# Patient Record
Sex: Female | Born: 1985 | Race: Black or African American | Hispanic: No | Marital: Married | Smoking: Never smoker
Health system: Southern US, Community
[De-identification: ages and names within clinical notes are randomized; demographics above are authoritative.]

## PROBLEM LIST (undated history)

## (undated) ENCOUNTER — Inpatient Hospital Stay (HOSPITAL_COMMUNITY): Payer: Self-pay

## (undated) DIAGNOSIS — B977 Papillomavirus as the cause of diseases classified elsewhere: Secondary | ICD-10-CM

## (undated) DIAGNOSIS — Z9889 Other specified postprocedural states: Secondary | ICD-10-CM

## (undated) DIAGNOSIS — IMO0002 Reserved for concepts with insufficient information to code with codable children: Secondary | ICD-10-CM

## (undated) DIAGNOSIS — N870 Mild cervical dysplasia: Secondary | ICD-10-CM

## (undated) HISTORY — DX: Reserved for concepts with insufficient information to code with codable children: IMO0002

## (undated) HISTORY — DX: Papillomavirus as the cause of diseases classified elsewhere: B97.7

## (undated) HISTORY — PX: NO PAST SURGERIES: SHX2092

## (undated) HISTORY — DX: Other specified postprocedural states: Z98.890

## (undated) HISTORY — DX: Mild cervical dysplasia: N87.0

---

## 2009-05-03 DIAGNOSIS — B977 Papillomavirus as the cause of diseases classified elsewhere: Secondary | ICD-10-CM

## 2009-05-03 HISTORY — DX: Papillomavirus as the cause of diseases classified elsewhere: B97.7

## 2009-09-08 ENCOUNTER — Encounter: Admission: RE | Admit: 2009-09-08 | Discharge: 2009-09-08 | Payer: Self-pay | Admitting: Infectious Diseases

## 2010-05-03 DIAGNOSIS — N870 Mild cervical dysplasia: Secondary | ICD-10-CM

## 2010-05-03 DIAGNOSIS — IMO0002 Reserved for concepts with insufficient information to code with codable children: Secondary | ICD-10-CM

## 2010-05-03 HISTORY — DX: Reserved for concepts with insufficient information to code with codable children: IMO0002

## 2010-05-03 HISTORY — DX: Mild cervical dysplasia: N87.0

## 2011-11-09 ENCOUNTER — Ambulatory Visit (INDEPENDENT_AMBULATORY_CARE_PROVIDER_SITE_OTHER): Payer: BC Managed Care – PPO | Admitting: Obstetrics and Gynecology

## 2011-11-09 ENCOUNTER — Encounter: Payer: Self-pay | Admitting: Obstetrics and Gynecology

## 2011-11-09 VITALS — BP 104/60 | HR 68 | Resp 16 | Ht 64.0 in | Wt 144.0 lb

## 2011-11-09 DIAGNOSIS — R87612 Low grade squamous intraepithelial lesion on cytologic smear of cervix (LGSIL): Secondary | ICD-10-CM

## 2011-11-09 DIAGNOSIS — IMO0002 Reserved for concepts with insufficient information to code with codable children: Secondary | ICD-10-CM

## 2011-11-09 DIAGNOSIS — Z01419 Encounter for gynecological examination (general) (routine) without abnormal findings: Secondary | ICD-10-CM

## 2011-11-09 MED ORDER — NORGESTIM-ETH ESTRAD TRIPHASIC 0.18/0.215/0.25 MG-35 MCG PO TABS: 1.0000 | ORAL_TABLET | Freq: Every day | ORAL | Status: DC

## 2011-11-09 NOTE — Progress Notes (Signed)
Contraception none Last pap 01/2011 LSIL HPV/Mild Dysplasia/CINI Last Mammo none Last Colonoscopy none Last Dexa Scan none Primary MD Dawn Metkalf Abuse at Home none  No complaints.  Wants BC.  Filed Vitals:   11/09/11 1650  BP: 104/60  Pulse: 68  Resp: 16   ROS: noncontributory  Physical Examination: General appearance - alert, well appearing, and in no distress Neck - supple, no significant adenopathy Chest - clear to auscultation, no wheezes, rales or rhonchi, symmetric air entry Heart - normal rate and regular rhythm Abdomen - soft, nontender, nondistended, no masses or organomegaly Breasts - breasts appear normal, no suspicious masses, no skin or nipple changes or axillary nodes Pelvic - normal external genitalia, vulva, vagina, cervix, uterus and adnexa Back exam - no CVAT Extremities - no edema, redness or tenderness in the calves or thighs  A/P OTC Pap

## 2011-11-11 LAB — PAP IG W/ RFLX HPV ASCU

## 2011-11-14 DIAGNOSIS — IMO0002 Reserved for concepts with insufficient information to code with codable children: Secondary | ICD-10-CM | POA: Insufficient documentation

## 2011-11-14 NOTE — Progress Notes (Signed)
Quick Note:  Please schedule colposcopy. Thanks ______ 

## 2011-11-16 ENCOUNTER — Telehealth: Payer: Self-pay

## 2011-11-16 NOTE — Telephone Encounter (Signed)
Attempted to call pt to advise Colpo needed and pt's phone was out of network unable to leave msg. Will try to call again later. Mathis Bud

## 2011-11-17 ENCOUNTER — Telehealth: Payer: Self-pay

## 2011-11-17 NOTE — Telephone Encounter (Signed)
Attempted to reach pt again regarding scheduling Colpo, pt phone still not working. Will try to contact over the next few days and if no response letter will be sent. Mathis Bud

## 2011-11-22 ENCOUNTER — Telehealth: Payer: Self-pay

## 2011-11-22 NOTE — Telephone Encounter (Signed)
Pt's phone is still out of order. Letter will be mailed regarding pap results/ need colpo. Brittany Wiggins

## 2012-01-17 ENCOUNTER — Telehealth: Payer: Self-pay | Admitting: Obstetrics and Gynecology

## 2012-01-18 ENCOUNTER — Other Ambulatory Visit: Payer: Self-pay

## 2012-01-18 ENCOUNTER — Encounter: Payer: Self-pay | Admitting: Obstetrics and Gynecology

## 2012-01-18 ENCOUNTER — Ambulatory Visit (INDEPENDENT_AMBULATORY_CARE_PROVIDER_SITE_OTHER): Payer: BC Managed Care – PPO | Admitting: Obstetrics and Gynecology

## 2012-01-18 VITALS — BP 116/72 | HR 78 | Wt 150.0 lb

## 2012-01-18 DIAGNOSIS — B373 Candidiasis of vulva and vagina: Secondary | ICD-10-CM

## 2012-01-18 DIAGNOSIS — N898 Other specified noninflammatory disorders of vagina: Secondary | ICD-10-CM

## 2012-01-18 DIAGNOSIS — Z113 Encounter for screening for infections with a predominantly sexual mode of transmission: Secondary | ICD-10-CM

## 2012-01-18 LAB — POCT WET PREP (WET MOUNT)
Whiff Test: NEGATIVE
pH: 4.5

## 2012-01-18 MED ORDER — FLUCONAZOLE 150 MG PO TABS: 150.0000 mg | ORAL_TABLET | Freq: Once | ORAL | Status: AC

## 2012-01-18 NOTE — Progress Notes (Signed)
26 YO complains of vaginal itching for the past 3 days but denies change in soap/detergents, antibiotics, urinary symptoms or changes in bowel movements. Admits to post coital dyspareunia but no other pelvic pain.  O: Pelvic: EGBUS-wnl except with adherent white discharge, vagina-large white discharge, cervix-no lesions, uterus/adnexae-no tenderness   Wet Prep:  pH-4.5, whiff-negative,  large yeast (pseudo-hyphae)  A: Yeast Vulvo-Vaginitis  P: perineal hygiene      STD-testing      Diflucan 150 mg #1 1 po stat 1 rf       RTO-as scheduled       Alveena Taira

## 2012-01-18 NOTE — Progress Notes (Signed)
Color: white Odor: no Itching:yes Thin:yes Thick:no Fever:no Dyspareunia:yes after Hx PID:no HX STD:no Pelvic Pain:no Desires Gc/CT:yes Desires HIV,RPR,HbsAG:yes

## 2012-01-18 NOTE — Patient Instructions (Signed)
Avoid: - excess soap on genital area (consider using plain oatmeal soap) - use of powder or sprays in genital area - douching - wearing underwear to bed (except with menses) - using more than is directed detergent when washing clothes - tight fitting garments around genital area - excess sugar intake   

## 2012-01-19 LAB — HIV ANTIBODY (ROUTINE TESTING W REFLEX): HIV: NONREACTIVE

## 2012-01-19 LAB — GC/CHLAMYDIA PROBE AMP, GENITAL: GC Probe Amp, Genital: NEGATIVE

## 2012-01-19 LAB — HSV 2 ANTIBODY, IGG: HSV 2 Glycoprotein G Ab, IgG: <0.1 IV

## 2012-01-19 LAB — RPR

## 2012-05-26 ENCOUNTER — Ambulatory Visit: Payer: BC Managed Care – PPO | Admitting: Obstetrics and Gynecology

## 2012-05-26 ENCOUNTER — Encounter: Payer: Self-pay | Admitting: Obstetrics and Gynecology

## 2012-05-26 VITALS — BP 120/64 | Ht 64.0 in | Wt 152.0 lb

## 2012-05-26 DIAGNOSIS — Z139 Encounter for screening, unspecified: Secondary | ICD-10-CM

## 2012-05-26 DIAGNOSIS — Z113 Encounter for screening for infections with a predominantly sexual mode of transmission: Secondary | ICD-10-CM

## 2012-05-26 DIAGNOSIS — N760 Acute vaginitis: Secondary | ICD-10-CM

## 2012-05-26 DIAGNOSIS — N898 Other specified noninflammatory disorders of vagina: Secondary | ICD-10-CM

## 2012-05-26 LAB — POCT WET PREP (WET MOUNT)
Clue Cells Wet Prep Whiff POC: NEGATIVE
pH: 4.5

## 2012-05-26 NOTE — Progress Notes (Signed)
C/o itching in vagina  Filed Vitals:   05/26/12 1430  BP: 120/64   ROS: noncontributory  Pelvic exam:  VULVA: normal appearing vulva with no masses, tenderness or lesions,  VAGINA: normal appearing vagina with normal color and discharge, no lesions, CERVIX: normal appearing cervix without discharge or lesions,  UTERUS: uterus is normal size, shape, consistency and nontender,  ADNEXA: normal adnexa in size, nontender and no masses.  A/P Wet prep - neg Vaginitis - trial of diflucan Check HSV 1 and 2 and vit D (pt reports recurrent vaginitis since August) LGSIL in July with no f/u - sched colpo will likely repeat pap that day as well

## 2012-05-27 LAB — VITAMIN D 25 HYDROXY (VIT D DEFICIENCY, FRACTURES): Vit D, 25-Hydroxy: 26 ng/mL — ABNORMAL LOW (ref 30–89)

## 2012-06-01 ENCOUNTER — Other Ambulatory Visit: Payer: Self-pay

## 2012-06-01 ENCOUNTER — Telehealth: Payer: Self-pay

## 2012-06-01 DIAGNOSIS — E559 Vitamin D deficiency, unspecified: Secondary | ICD-10-CM

## 2012-06-01 MED ORDER — VITAMIN D3 1.25 MG (50000 UT) PO CAPS: 1.0000 | ORAL_CAPSULE | ORAL | Status: DC

## 2012-06-01 NOTE — Telephone Encounter (Signed)
E-rx'd Vitamin D protocol 50,000 units 1x q week x 12 weeks to [facility] [address] . Future lab and recall entered. Melody Comas A

## 2012-06-01 NOTE — Telephone Encounter (Signed)
Notified pt of Vit . D deficiency . Pt will pick up Vitamin D Rx at pharmacy. Melody Comas A

## 2012-12-12 ENCOUNTER — Inpatient Hospital Stay (HOSPITAL_COMMUNITY)
Admission: AD | Admit: 2012-12-12 | Discharge: 2012-12-12 | Disposition: A | Discharge status: Home / Self Care | Payer: BC Managed Care – PPO | Source: Ambulatory Visit | Attending: Obstetrics and Gynecology | Admitting: Obstetrics and Gynecology

## 2012-12-12 ENCOUNTER — Encounter (HOSPITAL_COMMUNITY): Payer: Self-pay | Admitting: *Deleted

## 2012-12-12 DIAGNOSIS — O200 Threatened abortion: Secondary | ICD-10-CM | POA: Insufficient documentation

## 2012-12-12 DIAGNOSIS — R109 Unspecified abdominal pain: Secondary | ICD-10-CM | POA: Insufficient documentation

## 2012-12-12 LAB — WET PREP, GENITAL
Clue Cells Wet Prep HPF POC: NONE SEEN
Trich, Wet Prep: NONE SEEN
WBC, Wet Prep HPF POC: NONE SEEN
Yeast Wet Prep HPF POC: NONE SEEN

## 2012-12-12 LAB — URINALYSIS, ROUTINE W REFLEX MICROSCOPIC
Nitrite: NEGATIVE
Protein, ur: NEGATIVE mg/dL
Urobilinogen, UA: 0.2 mg/dL (ref 0.0–1.0)
pH: 6 (ref 5.0–8.0)

## 2012-12-12 LAB — URINE MICROSCOPIC-ADD ON

## 2012-12-12 LAB — CBC
HCT: 35.8 % — ABNORMAL LOW (ref 36.0–46.0)
MCH: 28.1 pg (ref 26.0–34.0)

## 2012-12-12 LAB — HCG, QUANTITATIVE, PREGNANCY: hCG, Beta Chain, Quant, S: 1261 m[IU]/mL — ABNORMAL HIGH

## 2012-12-12 LAB — ABO/RH: ABO/RH(D): B POS

## 2012-12-12 NOTE — Progress Notes (Addendum)
MAU NOTE: (assumed care of pt at 1900 on 12/12/12) S:  Pt is w/o pain presently; reports occ'l cramping.  Found out pregnant over last weekend.  Unplanned pregnancy; not on any contraception.  Pt is a 1st grade teacher.  S.o. At bs; he is a Runner, broadcasting/film/video also (HS).  No IC in last few days.  Has [facility] card confirmed B pos blood type.   O: PE: Gen:  NAD, A&OX3, pleasant, glasses Abd: soft, NT, no rebound or guarding SSE: mod amt of BRB in vault w/ pieces of tissues, stringy blood clots noted; cleared vault w/ 3-4 fox swabs; wet prep and gc/ct sent  Results for orders placed during the hospital encounter of 12/12/12 (from the past 24 hour(s))  CBC     Status: Abnormal   Collection Time    12/12/12  4:00 PM      Result Value Range   WBC 7.2  4.0 - 10.5 K/uL   RBC 4.30  3.87 - 5.11 MIL/uL   Hemoglobin 12.1  12.0 - 15.0 g/dL   HCT 40.9 (*) 81.1 - 91.4 %   MCV 83.3  78.0 - 100.0 fL   MCH 28.1  26.0 - 34.0 pg   MCHC 33.8  30.0 - 36.0 g/dL   RDW 78.2  95.6 - 21.3 %   Platelets 314  150 - 400 K/uL  HCG, QUANTITATIVE, PREGNANCY     Status: Abnormal   Collection Time    12/12/12  4:00 PM      Result Value Range   hCG, Beta Chain, Quant, S 1261 (*) <5 mIU/mL  ABO/RH     Status: None   Collection Time    12/12/12  4:00 PM      Result Value Range   ABO/RH(D) B POS    URINALYSIS, ROUTINE W REFLEX MICROSCOPIC     Status: Abnormal   Collection Time    12/12/12  7:01 PM      Result Value Range   Color, Urine YELLOW  YELLOW   APPearance CLEAR  CLEAR   Specific Gravity, Urine >1.030 (*) 1.005 - 1.030   pH 6.0  5.0 - 8.0   Glucose, UA NEGATIVE  NEGATIVE mg/dL   Hgb urine dipstick LARGE (*) NEGATIVE   Bilirubin Urine NEGATIVE  NEGATIVE   Ketones, ur NEGATIVE  NEGATIVE mg/dL   Protein, ur NEGATIVE  NEGATIVE mg/dL   Urobilinogen, UA 0.2  0.0 - 1.0 mg/dL   Nitrite NEGATIVE  NEGATIVE   Leukocytes, UA NEGATIVE  NEGATIVE  URINE MICROSCOPIC-ADD ON     Status: Abnormal   Collection Time    12/12/12   7:01 PM      Result Value Range   Squamous Epithelial / LPF FEW (*) RARE   WBC, UA 0-2  <3 WBC/hpf   RBC / HPF TOO NUMEROUS TO COUNT  <3 RBC/hpf   Bacteria, UA MANY (*) RARE  POCT PREGNANCY, URINE     Status: Abnormal   Collection Time    12/12/12  7:09 PM      Result Value Range   Preg Test, Ur POSITIVE (*) NEGATIVE   A:  1. [redacted]w[redacted]d 2. Threatened AB  3. Rh pos 4. VB in 1st trimester 5. Inadequate water intake 6. Gc/ct cx and wet prep negative  P:  1. D/c'd home to f/u for lab only visit this Thurs (late afternoon) at [facility] to repeat quant      2. Bleeding and SAB precautions rev'd; rec'd daily PNV  3. F/u prn; if quant rises appropriately, will schedule NOB and viability u/s C. Denny Levy, CNM

## 2012-12-12 NOTE — MAU Provider Note (Signed)
History   27yo G1P0 at  Presents with bleeding and cramping after 2 pos HPT.  Denies recent fever, resp or GI c/o's, UTI s/s.  Chief Complaint  Patient presents with  . Vaginal Bleeding  . Possible Pregnancy    OB History   Grav Para Term Preterm Abortions TAB SAB Ect Mult Living   1 0              Past Medical History  Diagnosis Date  . LSIL (low grade squamous intraepithelial lesion) on Pap smear 2012  . Dysplasia of cervix, low grade (CIN 1) 2012  . HPV (human papilloma virus) infection 2011  . Hx of colposcopy with cervical biopsy 2010, 2012    Past Surgical History  Procedure Laterality Date  . No past surgeries      Family History  Problem Relation Age of Onset  . Hypertension Mother   . Hypertension Father   . Diabetes Father   . Migraines Father   . Diabetes Paternal Grandmother   . Diabetes Paternal Grandfather     History  Substance Use Topics  . Smoking status: Never Smoker   . Smokeless tobacco: Never Used  . Alcohol Use: Yes    Allergies:  Allergies  Allergen Reactions  . Fish Allergy     No prescriptions prior to admission    ROS: see HPI above, all other systems are negative   Physical Exam   Blood pressure 130/82, pulse 54, temperature 98.6 F (37 C), temperature source Oral, resp. rate 18, height 5\' 3"  (1.6 m), weight 161 lb (73.029 kg), last menstrual period 11/11/2012.    ED Course  IUP at [redacted]w[redacted]d Bleeding in early pregnancy  CBC  Care transferred to nightshift CNM  Haroldine Laws CNM, MSN 12/13/2012 8:19 AM

## 2012-12-12 NOTE — MAU Note (Signed)
Period was due on Fri, wasn't feeling right, did a test on Sat and Sun, both were positive.  Lower abd pain- cramping like with period. Spotting this morning, then had increase in cramping, passed a 'glob of blood' put on pad, saturated pad in a couple hrs, passed another glob.

## 2012-12-13 LAB — GC/CHLAMYDIA PROBE AMP: CT Probe RNA: NEGATIVE

## 2013-05-29 ENCOUNTER — Ambulatory Visit (HOSPITAL_COMMUNITY): Payer: BC Managed Care – PPO

## 2013-06-05 ENCOUNTER — Ambulatory Visit (HOSPITAL_COMMUNITY): Payer: BC Managed Care – PPO

## 2013-10-17 ENCOUNTER — Encounter (HOSPITAL_COMMUNITY): Payer: Self-pay | Admitting: *Deleted

## 2013-11-14 LAB — OB RESULTS CONSOLE RPR: RPR: NONREACTIVE

## 2013-11-14 LAB — OB RESULTS CONSOLE GC/CHLAMYDIA
CHLAMYDIA, DNA PROBE: NEGATIVE
Gonorrhea: NEGATIVE

## 2013-11-14 LAB — OB RESULTS CONSOLE HEPATITIS B SURFACE ANTIGEN: Hepatitis B Surface Ag: NEGATIVE

## 2013-11-14 LAB — OB RESULTS CONSOLE RUBELLA ANTIBODY, IGM: RUBELLA: IMMUNE

## 2013-11-14 LAB — OB RESULTS CONSOLE HIV ANTIBODY (ROUTINE TESTING): HIV: NONREACTIVE

## 2014-03-04 ENCOUNTER — Encounter (HOSPITAL_COMMUNITY): Payer: Self-pay | Admitting: *Deleted

## 2014-05-03 NOTE — L&D Delivery Note (Signed)
Operative Delivery Note  Due to late decelerations with pushing, operative vaginal delivery was discussed with patient.  Verbal consent: obtained from patient.  Risks and benefits discussed in detail.  Risks include, but are not limited to the risks of anesthesia, bleeding, infection, damage to maternal tissues, fetal cephalhematoma.  There is also the risk of inability to effect vaginal delivery of the head, or shoulder dystocia that cannot be resolved by established maneuvers, leading to the need for emergency cesarean section.  Kiwi vacuum applied, 3 pulls, no pop offs.  At 10:40 AM a viable female was delivered via Vaginal, Vacuum Investment banker, operational(Extractor).  Presentation: vertex; Position: Left,, Occiput,, Anterior; Station: +3.  APGAR: 9, 9; weight  pending Placenta status: Intact, Spontaneous.   Cord: 3 vessels with the following complications: None.  Cord pH: pending  Anesthesia: Epidural  Instruments: Kiwi vacuum Episiotomy: None Lacerations: 2nd degree Suture Repair: 2.0 3.0 vicryl Est. Blood Loss (mL):  350cc  Mom to postpartum.  Baby to Couplet care / Skin to Skin.  Brittany Wiggins, Brittany Wiggins, M 06/15/2014, 11:20 AM

## 2014-06-09 LAB — OB RESULTS CONSOLE GBS: STREP GROUP B AG: NEGATIVE

## 2014-06-15 ENCOUNTER — Inpatient Hospital Stay (HOSPITAL_COMMUNITY)
Admission: AD | Admit: 2014-06-15 | Discharge: 2014-06-17 | DRG: 775 | Disposition: A | Discharge status: Home / Self Care | Payer: BC Managed Care – PPO | Source: Ambulatory Visit | Attending: Obstetrics and Gynecology | Admitting: Obstetrics and Gynecology

## 2014-06-15 ENCOUNTER — Inpatient Hospital Stay (HOSPITAL_COMMUNITY): Payer: BC Managed Care – PPO

## 2014-06-15 ENCOUNTER — Encounter (HOSPITAL_COMMUNITY): Payer: Self-pay | Admitting: Anesthesiology

## 2014-06-15 ENCOUNTER — Inpatient Hospital Stay (HOSPITAL_COMMUNITY): Payer: BC Managed Care – PPO | Admitting: Anesthesiology

## 2014-06-15 DIAGNOSIS — Z8759 Personal history of other complications of pregnancy, childbirth and the puerperium: Secondary | ICD-10-CM

## 2014-06-15 DIAGNOSIS — Z3A37 37 weeks gestation of pregnancy: Secondary | ICD-10-CM | POA: Diagnosis present

## 2014-06-15 DIAGNOSIS — Z3483 Encounter for supervision of other normal pregnancy, third trimester: Secondary | ICD-10-CM | POA: Diagnosis present

## 2014-06-15 DIAGNOSIS — O9902 Anemia complicating childbirth: Secondary | ICD-10-CM | POA: Diagnosis present

## 2014-06-15 DIAGNOSIS — D649 Anemia, unspecified: Secondary | ICD-10-CM | POA: Diagnosis present

## 2014-06-15 DIAGNOSIS — T81509A Unspecified complication of foreign body accidentally left in body following unspecified procedure, initial encounter: Secondary | ICD-10-CM

## 2014-06-15 DIAGNOSIS — IMO0001 Reserved for inherently not codable concepts without codable children: Secondary | ICD-10-CM

## 2014-06-15 LAB — CBC
HEMATOCRIT: 34.8 % — AB (ref 36.0–46.0)
Hemoglobin: 11.8 g/dL — ABNORMAL LOW (ref 12.0–15.0)
MCH: 28.9 pg (ref 26.0–34.0)
MCHC: 33.9 g/dL (ref 30.0–36.0)
MCV: 85.3 fL (ref 78.0–100.0)
PLATELETS: 313 10*3/uL (ref 150–400)
RBC: 4.08 MIL/uL (ref 3.87–5.11)
RDW: 14.2 % (ref 11.5–15.5)
WBC: 9.5 10*3/uL (ref 4.0–10.5)

## 2014-06-15 LAB — TYPE AND SCREEN
ABO/RH(D): B POS
ANTIBODY SCREEN: NEGATIVE

## 2014-06-15 MED ORDER — NALBUPHINE HCL 10 MG/ML IJ SOLN: 10.0000 mg | INTRAMUSCULAR | Status: DC | PRN

## 2014-06-15 MED ORDER — TERBUTALINE SULFATE 1 MG/ML IJ SOLN
0.2500 mg | Freq: Once | INTRAMUSCULAR | Status: DC | PRN
  Filled 2014-06-15: qty 1

## 2014-06-15 MED ORDER — PHENYLEPHRINE 40 MCG/ML (10ML) SYRINGE FOR IV PUSH (FOR BLOOD PRESSURE SUPPORT)
80.0000 ug | PREFILLED_SYRINGE | INTRAVENOUS | Status: DC | PRN
  Filled 2014-06-15: qty 2
  Filled 2014-06-15: qty 20

## 2014-06-15 MED ORDER — EPHEDRINE 5 MG/ML INJ
10.0000 mg | INTRAVENOUS | Status: DC | PRN
  Filled 2014-06-15: qty 2

## 2014-06-15 MED ORDER — OXYCODONE-ACETAMINOPHEN 5-325 MG PO TABS: 1.0000 | ORAL_TABLET | ORAL | Status: DC | PRN

## 2014-06-15 MED ORDER — ACETAMINOPHEN 325 MG PO TABS: 650.0000 mg | ORAL_TABLET | ORAL | Status: DC | PRN

## 2014-06-15 MED ORDER — PHENYLEPHRINE 40 MCG/ML (10ML) SYRINGE FOR IV PUSH (FOR BLOOD PRESSURE SUPPORT)
80.0000 ug | PREFILLED_SYRINGE | INTRAVENOUS | Status: DC | PRN
  Filled 2014-06-15: qty 2

## 2014-06-15 MED ORDER — OXYTOCIN 40 UNITS IN LACTATED RINGERS INFUSION - SIMPLE MED
1.0000 m[IU]/min | INTRAVENOUS | Status: DC
  Administered 2014-06-15: 2 m[IU]/min via INTRAVENOUS

## 2014-06-15 MED ORDER — LACTATED RINGERS IV SOLN
500.0000 mL | Freq: Once | INTRAVENOUS | Status: AC
  Administered 2014-06-15: 500 mL via INTRAVENOUS

## 2014-06-15 MED ORDER — LANOLIN HYDROUS EX OINT: TOPICAL_OINTMENT | CUTANEOUS | Status: DC | PRN

## 2014-06-15 MED ORDER — ZOLPIDEM TARTRATE 5 MG PO TABS: 5.0000 mg | ORAL_TABLET | Freq: Every evening | ORAL | Status: DC | PRN

## 2014-06-15 MED ORDER — LACTATED RINGERS IV SOLN
INTRAVENOUS | Status: DC
  Administered 2014-06-15 (×2): via INTRAVENOUS

## 2014-06-15 MED ORDER — DIBUCAINE 1 % RE OINT
1.0000 "application " | TOPICAL_OINTMENT | RECTAL | Status: DC | PRN
  Administered 2014-06-16: 1 via RECTAL
  Filled 2014-06-15: qty 28

## 2014-06-15 MED ORDER — IBUPROFEN 600 MG PO TABS
600.0000 mg | ORAL_TABLET | Freq: Four times a day (QID) | ORAL | Status: DC
  Administered 2014-06-15 – 2014-06-17 (×8): 600 mg via ORAL
  Filled 2014-06-15 (×8): qty 1

## 2014-06-15 MED ORDER — OXYCODONE-ACETAMINOPHEN 5-325 MG PO TABS: 2.0000 | ORAL_TABLET | ORAL | Status: DC | PRN

## 2014-06-15 MED ORDER — OXYCODONE-ACETAMINOPHEN 5-325 MG PO TABS
1.0000 | ORAL_TABLET | ORAL | Status: DC | PRN
  Filled 2014-06-15: qty 1

## 2014-06-15 MED ORDER — OXYTOCIN BOLUS FROM INFUSION: 500.0000 mL | INTRAVENOUS | Status: DC

## 2014-06-15 MED ORDER — ONDANSETRON HCL 4 MG/2ML IJ SOLN: 4.0000 mg | Freq: Four times a day (QID) | INTRAMUSCULAR | Status: DC | PRN

## 2014-06-15 MED ORDER — ONDANSETRON HCL 4 MG/2ML IJ SOLN: 4.0000 mg | INTRAMUSCULAR | Status: DC | PRN

## 2014-06-15 MED ORDER — LIDOCAINE HCL (PF) 1 % IJ SOLN
30.0000 mL | INTRAMUSCULAR | Status: DC | PRN
  Filled 2014-06-15: qty 30

## 2014-06-15 MED ORDER — LACTATED RINGERS IV SOLN: 500.0000 mL | INTRAVENOUS | Status: DC | PRN

## 2014-06-15 MED ORDER — DIPHENHYDRAMINE HCL 25 MG PO CAPS: 25.0000 mg | ORAL_CAPSULE | Freq: Four times a day (QID) | ORAL | Status: DC | PRN

## 2014-06-15 MED ORDER — DIPHENHYDRAMINE HCL 50 MG/ML IJ SOLN: 12.5000 mg | INTRAMUSCULAR | Status: DC | PRN

## 2014-06-15 MED ORDER — SIMETHICONE 80 MG PO CHEW: 80.0000 mg | CHEWABLE_TABLET | ORAL | Status: DC | PRN

## 2014-06-15 MED ORDER — FENTANYL 2.5 MCG/ML BUPIVACAINE 1/10 % EPIDURAL INFUSION (WH - ANES)
14.0000 mL/h | INTRAMUSCULAR | Status: DC | PRN
  Administered 2014-06-15: 14 mL/h via EPIDURAL
  Filled 2014-06-15: qty 125

## 2014-06-15 MED ORDER — SENNOSIDES-DOCUSATE SODIUM 8.6-50 MG PO TABS
2.0000 | ORAL_TABLET | ORAL | Status: DC
  Administered 2014-06-15 – 2014-06-16 (×2): 2 via ORAL
  Filled 2014-06-15 (×2): qty 2

## 2014-06-15 MED ORDER — CITRIC ACID-SODIUM CITRATE 334-500 MG/5ML PO SOLN: 30.0000 mL | ORAL | Status: DC | PRN

## 2014-06-15 MED ORDER — TETANUS-DIPHTH-ACELL PERTUSSIS 5-2.5-18.5 LF-MCG/0.5 IM SUSP: 0.5000 mL | Freq: Once | INTRAMUSCULAR | Status: DC

## 2014-06-15 MED ORDER — ONDANSETRON HCL 4 MG PO TABS: 4.0000 mg | ORAL_TABLET | ORAL | Status: DC | PRN

## 2014-06-15 MED ORDER — BENZOCAINE-MENTHOL 20-0.5 % EX AERO
1.0000 "application " | INHALATION_SPRAY | CUTANEOUS | Status: DC | PRN
  Administered 2014-06-15: 1 via TOPICAL
  Filled 2014-06-15 (×2): qty 56

## 2014-06-15 MED ORDER — OXYTOCIN 40 UNITS IN LACTATED RINGERS INFUSION - SIMPLE MED
62.5000 mL/h | INTRAVENOUS | Status: DC
  Filled 2014-06-15: qty 1000

## 2014-06-15 MED ORDER — LIDOCAINE HCL (PF) 1 % IJ SOLN
INTRAMUSCULAR | Status: DC | PRN
  Administered 2014-06-15 (×2): 4 mL

## 2014-06-15 MED ORDER — WITCH HAZEL-GLYCERIN EX PADS: 1.0000 "application " | MEDICATED_PAD | CUTANEOUS | Status: DC | PRN

## 2014-06-15 MED ORDER — FENTANYL 2.5 MCG/ML BUPIVACAINE 1/10 % EPIDURAL INFUSION (WH - ANES)
INTRAMUSCULAR | Status: DC | PRN
  Administered 2014-06-15: 14 mL/h via EPIDURAL

## 2014-06-15 NOTE — MAU Note (Signed)
[facility] OK to bring pt to 162

## 2014-06-15 NOTE — H&P (Signed)
Brittany Wiggins is a 29 y.o. female, G3P0020 at 37.3 weeks, presenting for contractions. Patient reports contractions started at 9pm.  Reports good fetal movement and denies VB and LOF.  Patient states that she desires pain management.  GBS negative and   Patient Active Problem List   Diagnosis Date Noted  . LGSIL (low grade squamous intraepithelial dysplasia) 11/14/2011    History of present pregnancy: Patient entered care at 7.3 weeks.   EDC of 07/01/2014 was established by Definite LMP on 09/24/2013 and c/w 7.1wk US on 11/15/2013.   Anatomy scan:  19 weeks, with normal, but limited, findings and an posterior placenta.   Additional US evaluations:   -21.5wks: F/U Anatomy: EFW 472g, cervix 4.81cm, Cardiac anatomy was seen except Axis with now Left ventricle EIF-.   Significant prenatal events:  Patient with complaint of edema, nausea, and vomiting   Last evaluation:  06/10/2014 by Dr. Dorris CarnesN. Dillard VE 1/50/-2, FHR 146, BP 120/60, wt 216lbs  OB History    Gravida Para Term Preterm AB TAB SAB Ectopic Multiple Living   1 0             Past Medical History  Diagnosis Date  . LSIL (low grade squamous intraepithelial lesion) on Pap smear 2012  . Dysplasia of cervix, low grade (CIN 1) 2012  . HPV (human papilloma virus) infection 2011  . Hx of colposcopy with cervical biopsy 2010, 2012   Past Surgical History  Procedure Laterality Date  . No past surgeries     Family History: family history includes Diabetes in her father, paternal grandfather, and paternal grandmother; Hypertension in her father and mother; Migraines in her father. Social History:  reports that she has never smoked. She has never used smokeless tobacco. She reports that she drinks alcohol. She reports that she does not use illicit drugs.   Prenatal Transfer Tool  Maternal Diabetes: No Genetic Screening: Declined Maternal Ultrasounds/Referrals: Abnormal:  Findings:   Isolated EIF (echogenic intracardiac focus) Fetal  Ultrasounds or other Referrals:  None Maternal Substance Abuse:  No Significant Maternal Medications:  None Significant Maternal Lab Results: Lab values include: Group B Strep negative    ROS:  +Ctx, +FM, -LOF, -VB  Allergies  Allergen Reactions  . Fish Allergy      Dilation: 7 Effacement (%): 100 Station: -2 Exam by:: Brittany Wiggins, CNM unknown if currently breastfeeding.  Physical Exam: General appearance: alert, well appearing, and in no distress. Head; normocephalic, atraumatic. PERLA. ENT- ENT exam normal, no neck nodes or sinus tenderness. Chest: clear to auscultation, no wheezes, rales or rhonchi, symmetric air entry.  CVS exam: normal rate and regular rhythm. Abdominal exam: Nontender, Appears AGA. Skin exam - normal coloration and turgor, no rashes, no suspicious skin lesions noted. Exam of extremities: peripheral pulses normal, no pedal edema, no clubbing or cyanosis  FHR: 145 bpm, Mod Var, -Decels, +Accels UCs:  Q472min, palpates strong  Prenatal labs: ABO, Rh:  B Positive Antibody:  Negative Rubella:   Immune RPR:   NR HBsAg:   Negative HIV:   Negative GBS:  Negative Sickle cell/Hgb electrophoresis:  Normal Pap:  Unknown GC:  Negative Chlamydia:  Negative Genetic screenings:  Declined Glucola:  Normal Other:  TSH-Normal at 2.375    Assessment IUP at 37.3wks Cat I FT Active Labor-Transition GBS Negative  [facility]: [facility]  Routine Labor and Delivery Orders per [facility] Protocol Okay for epidural   Brittany Wiggins Brittany Wiggins 06/15/2014, 5:12 AM

## 2014-06-15 NOTE — Progress Notes (Signed)
Addendum Prolong decels during pushing, Dr Charlotta Newtonzan called to the bedside for evaluation Returned to baseline without pushing.  Pt allowed to labor down

## 2014-06-15 NOTE — Progress Notes (Signed)
Additional rn at bedside to assist

## 2014-06-15 NOTE — Lactation Note (Signed)
This note was copied from the chart of Brittany Wiggins Dripps. Lactation Consultation Note  Patient Name: Brittany Wiggins Rowland ZOXWR'UToday's Date: 06/15/2014 Reason for consult: Initial assessment of this mom and baby at 6 hours pp. Mom is a primipara and at time of visit, baby asleep with no cues and mom resting.  She informs LC that she has attempted to latch baby a few times when she sees feeding cues but he falls asleep after latching to breast.  Mom says her nurse has demonstrated hand expression.  LC encouraged frequent STS and cue feedings. Mom to call for latch assistance this evening as needed. Mom encouraged to feed baby 8-12 times/24 hours and with feeding cues. LC encouraged review of Baby and Me pp 9, 14 and 20-25 for STS and BF information.   LC provided [facility] [employer] Resource brochure and reviewed [facility] services and list of community and web site resources.  Normal newborn sleepiness during baby's first 24 hours reviewed.    Maternal Data Formula Feeding for Exclusion: No Has patient been taught Hand Expression?: Yes (mom informs LC that her nurse has demonstrated hand expression) Does the patient have breastfeeding experience prior to this delivery?: No  Feeding    LATCH Score/Interventions            No latch yet          Lactation Tools Discussed/Used   STS, cue feedings, hand expression  Consult Status Consult Status: Follow-up Date: 06/16/14 Follow-up type: In-patient    Warrick ParisianBryant, Lassie Demorest [facility] 06/15/2014, 5:09 PM

## 2014-06-15 NOTE — MAU Note (Signed)
Pt states UCs started around 2100 06/14/14 and now are every 4min.  Pt denies LOF, states that baby is moving a lot.

## 2014-06-15 NOTE — Anesthesia Preprocedure Evaluation (Signed)
Anesthesia Evaluation  Patient identified by MRN, date of birth, ID band Patient awake    Reviewed: Allergy & Precautions, Patient's Chart, lab work & pertinent test results  Airway Mallampati: II  TM Distance: >3 FB Neck ROM: Full    Dental no notable dental hx. (+) Teeth Intact   Pulmonary neg pulmonary ROS,  breath sounds clear to auscultation  Pulmonary exam normal       Cardiovascular negative cardio ROS  Rhythm:Regular Rate:Normal     Neuro/Psych negative neurological ROS  negative psych ROS   GI/Hepatic negative GI ROS, Neg liver ROS,   Endo/Other  negative endocrine ROS  Renal/GU negative Renal ROS  negative genitourinary   Musculoskeletal negative musculoskeletal ROS (+)   Abdominal   Peds  Hematology  (+) anemia ,   Anesthesia Other Findings   Reproductive/Obstetrics (+) Pregnancy                             Anesthesia Physical Anesthesia Plan  ASA: II  Anesthesia Plan: Epidural   Post-op Pain Management:    Induction:   Airway Management Planned: Natural Airway  Additional Equipment:   Intra-op Plan:   Post-operative Plan:   Informed Consent: I have reviewed the patients History and Physical, chart, labs and discussed the procedure including the risks, benefits and alternatives for the proposed anesthesia with the patient or authorized representative who has indicated his/her understanding and acceptance.     Plan Discussed with: Anesthesiologist  Anesthesia Plan Comments:         Anesthesia Quick Evaluation

## 2014-06-15 NOTE — Anesthesia Postprocedure Evaluation (Signed)
  Anesthesia Post-op Note  Patient: Brittany Wiggins  Procedure(s) Performed: * No procedures listed *  Patient Location: Mother/Baby  Anesthesia Type:Epidural  Level of Consciousness: awake  Airway and Oxygen Therapy: Patient Spontanous Breathing  Post-op Pain: mild  Post-op Assessment: Patient's Cardiovascular Status Stable and Respiratory Function Stable  Post-op Vital Signs: stable  Last Vitals:  Filed Vitals:   06/15/14 1440  BP: 117/75  Pulse: 111  Temp: 36.8 C  Resp: 20    Complications: No apparent anesthesia complications

## 2014-06-15 NOTE — Progress Notes (Signed)
Labor Progress  Subjective: Very comfortable with the epidural, feeling increased pressure with each ctx  Objective: BP 133/85 mmHg  Pulse 91  Temp(Src) 98.4 F (36.9 C) (Axillary)  Resp 18  Ht 5\' 4"  (1.626 m)  Wt 220 lb (99.791 kg)  BMI 37.74 kg/m2  SpO2 96%  LMP 09/24/2013   Total I/O In: -  Out: 150 [Urine:150] FHT: 140, moderate variability, occasional variable decel CTX:  regular, every 5 minutes Uterus gravid, soft non tender SVE:  Dilation: 10 Effacement (%): 100 Station: +2 Exam by:: v Camille Thau cnm   Assessment:  IUP at 37.5 weeks NICHD: Category 2 Membranes:  AROM 0759  Labor progress: transition GBS: negative   Plan: Continue labor plan Continuous monitoring Rest Frequent position changes to facilitate fetal rotation and descent. Start trial pushing     Brittany Wiggins, CNM, MSN 06/15/2014. 8:35 AM

## 2014-06-15 NOTE — Anesthesia Procedure Notes (Signed)
Epidural Patient location during procedure: OB Start time: 06/15/2014 6:11 AM  Staffing Anesthesiologist: Malen GauzeFOSTER, Armilda Vanderlinden A. Performed by: anesthesiologist   Preanesthetic Checklist Completed: patient identified, site marked, surgical consent, pre-op evaluation, timeout performed, IV checked, risks and benefits discussed and monitors and equipment checked  Epidural Patient position: sitting Prep: site prepped and draped and DuraPrep Patient monitoring: continuous pulse ox and blood pressure Approach: midline Location: L3-L4 Injection technique: LOR air  Needle:  Needle type: Tuohy  Needle gauge: 17 G Needle length: 9 cm and 9 Needle insertion depth: 6 cm Catheter type: closed end flexible Catheter size: 19 Gauge Catheter at skin depth: 11 cm Test dose: negative and Other  Assessment Events: blood not aspirated, injection not painful, no injection resistance, negative IV test and no paresthesia  Additional Notes Patient identified. Risks and benefits discussed including failed block, incomplete  Pain control, post dural puncture headache, nerve damage, paralysis, blood pressure Changes, nausea, vomiting, reactions to medications-both toxic and allergic and post Partum back pain. All questions were answered. Patient expressed understanding and wished to proceed. Sterile technique was used throughout procedure. Epidural site was Dressed with sterile barrier dressing. No paresthesias, signs of intravascular injection Or signs of intrathecal spread were encountered.  Patient was more comfortable after the epidural was dosed. Please see RN's note for documentation of vital signs and FHR which are stable.

## 2014-06-16 DIAGNOSIS — Z8759 Personal history of other complications of pregnancy, childbirth and the puerperium: Secondary | ICD-10-CM

## 2014-06-16 LAB — CBC
HCT: 29.8 % — ABNORMAL LOW (ref 36.0–46.0)
HEMOGLOBIN: 10.3 g/dL — AB (ref 12.0–15.0)
MCH: 29.9 pg (ref 26.0–34.0)
MCHC: 34.6 g/dL (ref 30.0–36.0)
MCV: 86.6 fL (ref 78.0–100.0)
Platelets: 261 10*3/uL (ref 150–400)
RBC: 3.44 MIL/uL — AB (ref 3.87–5.11)
RDW: 14.8 % (ref 11.5–15.5)
WBC: 12.4 10*3/uL — ABNORMAL HIGH (ref 4.0–10.5)

## 2014-06-16 LAB — RPR: RPR Ser Ql: NONREACTIVE

## 2014-06-16 MED ORDER — FERROUS SULFATE 325 (65 FE) MG PO TABS
325.0000 mg | ORAL_TABLET | Freq: Two times a day (BID) | ORAL | Status: DC
  Administered 2014-06-16 – 2014-06-17 (×2): 325 mg via ORAL
  Filled 2014-06-16 (×2): qty 1

## 2014-06-16 NOTE — Lactation Note (Signed)
This note was copied from the chart of Brittany Derrek GuJoslyn Cowper. Lactation Consultation Note  Patient Name: Brittany Wiggins UJWJX'BToday's Date: 06/16/2014 Reason for consult: Follow-up assessment;Difficult latch;Hyperbilirubinemia   Follow-up at 30 hours old.  Infant is 37.[redacted] weeks GA with Hx cephalohematoma with increased bilirubin levels and was circumcised today.  Infant has had all breastfeeding attempts x 12 since birth (0-3 min with the longest as a 5 minute feed); voids-1; stools-1 in past 24 hours.   Infant was wrapped and in crib upon entering room.  Discussed with parents concern over feedings and "sleepy" baby with risks factors stated above (bili, hematoma) that could lead to more problems.  Discussed need to assure feedings.  Late Preterm Infant sheet given to parents and discussed possibility of needing to supplement based on feedings, lack of colostrum with HE with RN, and weight loss tonight.  Discussed with RN.  LC will continue to follow progress.  Parents were receptive to giving formula if needed.  Educated if supplementing to give amount on sheet based on day of life. Educated parents to feed with cues and if baby hasn't eaten in past 2-3 hours then undress, put skin-to-skin, and use waking techniques to awaken for feeding. Reviewed hand expression; LC was not able to HE any colostrum after several minutes of hand expressing.  Infant began showing cues with undressing so LC taught parents how to latch with depth using sandwiching technique (mom), tea cup hold (dad to assist with latching) and asymmetrical latching to assure depth with wide mouth and flanged lips.  Infant latched after a few attempts and sucked in a consistent pattern.  Infant fed on right side football hold for 25 minutes.  LS-7.  Few swallows heard with continuous compressions at breast.   Infant came off and mom then latched on left side football hold with minimal assistance from [facility]. Gave spoons, colostrum collection container,  and curved-tip syringe.  Discussed how to spoon feed any colostrum back to infant after breastfeeding. Mom has DEBP set up in room and has already pumped today.  Mom to post-pump during the day on preemie setting 3-4 teardrops after breastfeeding and give back any colostrum by spoon to infant.   Parents appreciative and receptive to teaching.  LC will continue to follow progress.  RN aware of plan.   Maternal Data Has patient been taught Hand Expression?: Yes  Feeding Feeding Type: Breast Fed Length of feed: 25 min  LATCH Score/Interventions Latch: Repeated attempts needed to sustain latch, nipple held in mouth throughout feeding, stimulation needed to elicit sucking reflex. Intervention(s): Skin to skin;Teach feeding cues;Waking techniques Intervention(s): Breast compression  Audible Swallowing: A few with stimulation Intervention(s): Hand expression;Skin to skin  Type of Nipple: Everted at rest and after stimulation  Comfort (Breast/Nipple): Soft / non-tender     Hold (Positioning): Assistance needed to correctly position infant at breast and maintain latch. Intervention(s): Breastfeeding basics reviewed;Support Pillows;Position options;Skin to skin  LATCH Score: 7  Lactation Tools Discussed/Used Initiated by:: RN   Consult Status Consult Status: Follow-up Date: 06/17/14 Follow-up type: In-patient    Lendon KaVann, Cristy Colmenares Walker 06/16/2014, 5:59 PM

## 2014-06-16 NOTE — Progress Notes (Addendum)
Brittany Wiggins   Subjective: Post Partum Day 1 Vacuum Assisted Vaginal delivery, 2 degree laceration Patient up ad lib, denies syncope or dizziness. Reports consuming regular diet without issues and denies N/V No issues with urination and reports bleeding is appropriate  Feeding:  breast Contraceptive plan:   unsure  Objective: Temp:  [97.9 F (36.6 C)-98.3 F (36.8 C)] 97.9 F (36.6 C) (02/14 0500) Pulse Rate:  [79-143] 79 (02/14 0500) Resp:  [18-20] 18 (02/14 0500) BP: (103-141)/(38-86) 126/72 mmHg (02/14 0500)  Physical Exam:  General: alert and cooperative Ext: WNL, no edema. No evidence of DVT seen on physical exam. No significant calf/ankle edema. Breast: Soft filling Lungs: CTAB Heart RRR without murmur  Abdomen:  Soft, fundus firm, lochia scant, + bowel sounds, non distended, non tender Lochia: appropriate Uterine Fundus: firm Laceration: healing well    Recent Labs  06/15/14 0520 06/16/14 0559  HGB 11.8* 10.3*  HCT 34.8* 29.8*    Assessment S/P Vaginal Delivery-Day 1 Stable  Normal Involution Breastfeeding Circumcision: In patient  Plan: Continue current care Plan for discharge tomorrow, Breastfeeding and Circumcision prior to discharge Lactation support Iron supplement    Varnika Butz, CNM, MSN 06/16/2014, 8:58 AM

## 2014-06-16 NOTE — Discharge Summary (Signed)
Vaginal Delivery Discharge Summary  ALL information will be verified prior to discharge  Brittany Wiggins  DOB:    26-Feb-1986 MRN:    [id number] CSN:    [id number]  Date of admission:                  06/15/14  Date of discharge:                   06/17/14  Procedures this admission: VAVD  Date of Delivery: 06/15/14  Newborn Data:  Live born  Information for the patient's newborn:  Brittany, Wiggins [[id number]]  female   Live born female  Birth Weight: 6 lb 11.4 oz (3045 g) APGAR: 9, 9  Home with mother. Name: Brittany Wiggins Circumcision Plan: in patient  History of Present Illness: [name] is a 29 y.o. female, G2P1001, who presents at [redacted]w[redacted]d weeks gestation. The patient has been followed at the [facility] and Gynecology division of [facility]. She was admitted onset of labor. Her pregnancy has been complicated by:  Patient Active Problem List   Diagnosis Date Noted  . 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   Her labor was complicated by late decels on pushing. She proceeded to have a vacuum assisted vaginal delivery of a healthy infant. Her delivery was not complicated. Her postpartum course was not complicated. She was discharged to home on postpartum day 2 doing well.  Feeding: breast  Contraception: unsure  Discharge hemoglobin: HEMOGLOBIN  Date Value Ref Range Status  06/16/2014 10.3* 12.0 - 15.0 g/dL Final   HCT  Date Value Ref Range Status  06/16/2014 29.8* 36.0 - 46.0 % Final    PreNatal Labs ABO, Rh: --/--/B POS (02/13 0520)   Antibody: NEG (02/13 0520) Rubella:   immune RPR: Non Reactive (02/13 0520)  HBsAg: Negative (07/15 0000)  HIV: Non-reactive (07/15 0000)  GBS: Negative (02/07 0000)  Discharge Physical Exam:  General: alert and cooperative Lochia: appropriate Uterine Fundus: firm Incision: healing well DVT Evaluation: No evidence  of DVT seen on physical exam.  Intrapartum Procedures: spontaneous vaginal delivery Postpartum Procedures: none Complications-Operative and Postpartum: 2 degree perineal laceration  Discharge Diagnoses: Term Pregnancy-delivered, symptomatic anemia  Activity:           pelvic rest Diet:                routine Medications: Ibuprofen, Iron and Percocet, Pt to continue taking her home PNV w/o DHA or fish by products Condition:      stable     Postpartum Teaching: Nutrition, exercise, return to work or school, family visits, sexual activity, home rest, vaginal bleeding, pelvic rest, family planning, s/s of PPD, breast care peri-care and incision care   Discharge to: home  Follow-up Information    Follow up with [facility]. Schedule an appointment as soon as possible for a visit in 6 weeks.   Specialty:  Obstetrics and Gynecology   Why:  Postpartum check up, Call with any questions or concerns   Contact information:   [address] [place] [address] [phone]       Adelina Mings, CNM, MSN 06/16/2014. 6:27 PM   Postpartum Care After Vaginal Delivery  After you deliver your newborn (postpartum period), the usual stay in the hospital is 24 72 hours. If there were problems with your labor or delivery, or if you  have other medical problems, you might be in the hospital longer.  While you are in the hospital, you will receive help and instructions on how to care for yourself and your newborn during the postpartum period.  While you are in the hospital:  Be sure to tell your nurses if you have pain or discomfort, as well as where you feel the pain and what makes the pain worse.  If you had an incision made near your vagina (episiotomy) or if you had some tearing during delivery, the nurses may put ice packs on your episiotomy or tear. The ice packs may help to reduce the pain and swelling.  If you are breastfeeding,  you may feel uncomfortable contractions of your uterus for a couple of weeks. This is normal. The contractions help your uterus get back to normal size.  It is normal to have some bleeding after delivery.  For the first 1 3 days after delivery, the flow is red and the amount may be similar to a period.  It is common for the flow to start and stop.  In the first few days, you may pass some small clots. Let your nurses know if you begin to pass large clots or your flow increases.  Do not  flush blood clots down the toilet before having the nurse look at them.  During the next 3 10 days after delivery, your flow should become more watery and pink or brown-tinged in color.  Ten to fourteen days after delivery, your flow should be a small amount of yellowish-white discharge.  The amount of your flow will decrease over the first few weeks after delivery. Your flow may stop in 6 8 weeks. Most women have had their flow stop by 12 weeks after delivery.  You should change your sanitary pads frequently.  Wash your hands thoroughly with soap and water for at least 20 seconds after changing pads, using the toilet, or before holding or feeding your newborn.  You should feel like you need to empty your bladder within the first 6 8 hours after delivery.  In case you become weak, lightheaded, or faint, call your nurse before you get out of bed for the first time and before you take a shower for the first time.  Within the first few days after delivery, your breasts may begin to feel tender and full. This is called engorgement. Breast tenderness usually goes away within 48 72 hours after engorgement occurs. You may also notice milk leaking from your breasts. If you are not breastfeeding, do not stimulate your breasts. Breast stimulation can make your breasts produce more milk.  Spending as much time as possible with your newborn is very important. During this time, you and your newborn can feel close and  get to know each other. Having your newborn stay in your room (rooming in) will help to strengthen the bond with your newborn. It will give you time to get to know your newborn and become comfortable caring for your newborn.  Your hormones change after delivery. Sometimes the hormone changes can temporarily cause you to feel sad or tearful. These feelings should not last more than a few days. If these feelings last longer than that, you should talk to your caregiver.  If desired, talk to your caregiver about methods of family planning or contraception.  Talk to your caregiver about immunizations. Your caregiver may want you to have the following immunizations before leaving the hospital:  Tetanus, diphtheria, and pertussis (Tdap)  or tetanus and diphtheria (Td) immunization. It is very important that you and your family (including grandparents) or others caring for your newborn are up-to-date with the Tdap or Td immunizations. The Tdap or Td immunization can help protect your newborn from getting ill.  Rubella immunization.  Varicella (chickenpox) immunization.  Influenza immunization. You should receive this annual immunization if you did not receive the immunization during your pregnancy. Document Released: 02/14/2007 Document Revised: 01/12/2012 Document Reviewed: 12/15/2011 [facility] Patient Information 2014 [place], [place].   Postpartum Depression and Baby Blues  The postpartum period begins right after the birth of a baby. During this time, there is often a great amount of joy and excitement. It is also a time of considerable changes in the life of the parent(s). Regardless of how many times a mother gives birth, each child brings new challenges and dynamics to the family. It is not unusual to have feelings of excitement accompanied by confusing shifts in moods, emotions, and thoughts. All mothers are at risk of developing postpartum depression or the "baby blues." These mood changes can  occur right after giving birth, or they may occur many months after giving birth. The baby blues or postpartum depression can be mild or severe. Additionally, postpartum depression can resolve rather quickly, or it can be a long-term condition. CAUSES Elevated hormones and their rapid decline are thought to be a main cause of postpartum depression and the baby blues. There are a number of hormones that radically change during and after pregnancy. Estrogen and progesterone usually decrease immediately after delivering your baby. The level of thyroid hormone and various cortisol steroids also rapidly drop. Other factors that play a major role in these changes include major life events and genetics.  RISK FACTORS If you have any of the following risks for the baby blues or postpartum depression, know what symptoms to watch out for during the postpartum period. Risk factors that may increase the likelihood of getting the baby blues or postpartum depression include: 1. Havinga personal or family history of depression. 2. Having depression while being pregnant. 3. Having premenstrual or oral contraceptive-associated mood issues. 4. Having exceptional life stress. 5. Having marital conflict. 6. Lacking a social support network. 7. Having a baby with special needs. 8. Having health problems such as diabetes. SYMPTOMS Baby blues symptoms include:  Brief fluctuations in mood, such as going from extreme happiness to sadness.  Decreased concentration.  Difficulty sleeping.  Crying spells, tearfulness.  Irritability.  Anxiety. Postpartum depression symptoms typically begin within the first month after giving birth. These symptoms include:  Difficulty sleeping or excessive sleepiness.  Marked weight loss.  Agitation.  Feelings of worthlessness.  Lack of interest in activity or food. Postpartum psychosis is a very concerning condition and can be dangerous. Fortunately, it is rare. Displaying  any of the following symptoms is cause for immediate medical attention. Postpartum psychosis symptoms include:  Hallucinations and delusions.  Bizarre or disorganized behavior.  Confusion or disorientation. DIAGNOSIS  A diagnosis is made by an evaluation of your symptoms. There are no medical or lab tests that lead to a diagnosis, but there are various questionnaires that a caregiver may use to identify those with the baby blues, postpartum depression, or psychosis. Often times, a screening tool called the [place] Postnatal Depression Scale is used to diagnose depression in the postpartum period.  TREATMENT The baby blues usually goes away on its own in 1 to 2 weeks. Social support is often all that is needed. You should  be encouraged to get adequate sleep and rest. Occasionally, you may be given medicines to help you sleep.  Postpartum depression requires treatment as it can last several months or longer if it is not treated. Treatment may include individual or group therapy, medicine, or both to address any social, physiological, and psychological factors that may play a role in the depression. Regular exercise, a healthy diet, rest, and social support may also be strongly recommended.  Postpartum psychosis is more serious and needs treatment right away. Hospitalization is often needed. HOME CARE INSTRUCTIONS  Get as much rest as you can. Nap when the baby sleeps.  Exercise regularly. Some women find yoga and walking to be beneficial.  Eat a balanced and nourishing diet.  Do little things that you enjoy. Have a cup of tea, take a bubble bath, read your favorite magazine, or listen to your favorite music.  Avoid alcohol.  Ask for help with household chores, cooking, grocery shopping, or running errands as needed. Do not try to do everything.  Talk to people close to you about how you are feeling. Get support from your partner, family members, friends, or other new moms.  Try to stay  positive in how you think. Think about the things you are grateful for.  Do not spend a lot of time alone.  Only take medicine as directed by your caregiver.  Keep all your postpartum appointments.  Let your caregiver know if you have any concerns. SEEK MEDICAL CARE IF: You are having a reaction or problems with your medicine. SEEK IMMEDIATE MEDICAL CARE IF:  You have suicidal feelings.  You feel you may harm the baby or someone else. Document Released: 01/22/2004 Document Revised: 07/12/2011 Document Reviewed: 02/23/2011 [facility] Patient Information 2014 [place], [place].     Breastfeeding Deciding to breastfeed is one of the best choices you can make for you and your baby. A change in hormones during pregnancy causes your breast tissue to grow and increases the number and size of your milk ducts. These hormones also allow proteins, sugars, and fats from your blood supply to make breast milk in your milk-producing glands. Hormones prevent breast milk from being released before your baby is born as well as prompt milk flow after birth. Once breastfeeding has begun, thoughts of your baby, as well as his or her sucking or crying, can stimulate the release of milk from your milk-producing glands.  BENEFITS OF BREASTFEEDING For Your Baby  Your first milk (colostrum) helps your baby's digestive system function better.   There are antibodies in your milk that help your baby fight off infections.   Your baby has a lower incidence of asthma, allergies, and sudden infant death syndrome.   The nutrients in breast milk are better for your baby than infant formulas and are designed uniquely for your baby's needs.   Breast milk improves your baby's brain development.   Your baby is less likely to develop other conditions, such as childhood obesity, asthma, or type 2 diabetes mellitus.  For You   Breastfeeding helps to create a very special bond between you and your baby.    Breastfeeding is convenient. Breast milk is always available at the correct temperature and costs nothing.   Breastfeeding helps to burn calories and helps you lose the weight gained during pregnancy.   Breastfeeding makes your uterus contract to its prepregnancy size faster and slows bleeding (lochia) after you give birth.   Breastfeeding helps to lower your risk of developing type 2  diabetes mellitus, osteoporosis, and breast or ovarian cancer later in life. SIGNS THAT YOUR BABY IS HUNGRY Early Signs of Hunger  Increased alertness or activity.  Stretching.  Movement of the head from side to side.  Movement of the head and opening of the mouth when the corner of the mouth or cheek is stroked (rooting).  Increased sucking sounds, smacking lips, cooing, sighing, or squeaking.  Hand-to-mouth movements.  Increased sucking of fingers or hands. Late Signs of Hunger  Fussing.  Intermittent crying. Extreme Signs of Hunger Signs of extreme hunger will require calming and consoling before your baby will be able to breastfeed successfully. Do not wait for the following signs of extreme hunger to occur before you initiate breastfeeding:   Restlessness.  A loud, strong cry.   Screaming.   BREASTFEEDING BASICS Breastfeeding Initiation  Find a comfortable place to sit or lie down, with your neck and back well supported.  Place a pillow or rolled up blanket under your baby to bring him or her to the level of your breast (if you are seated). Nursing pillows are specially designed to help support your arms and your baby while you breastfeed.  Make sure that your baby's abdomen is facing your abdomen.   Gently massage your breast. With your fingertips, massage from your chest wall toward your nipple in a circular motion. This encourages milk flow. You may need to continue this action during the feeding if your milk flows slowly.  Support your breast with 4 fingers underneath  and your thumb above your nipple. Make sure your fingers are well away from your nipple and your baby's mouth.   Stroke your baby's lips gently with your finger or nipple.   When your baby's mouth is open wide enough, quickly bring your baby to your breast, placing your entire nipple and as much of the colored area around your nipple (areola) as possible into your baby's mouth.   More areola should be visible above your baby's upper lip than below the lower lip.   Your baby's tongue should be between his or her lower gum and your breast.   Ensure that your baby's mouth is correctly positioned around your nipple (latched). Your baby's lips should create a seal on your breast and be turned out (everted).  It is common for your baby to suck about 2-3 minutes in order to start the flow of breast milk. Latching Teaching your baby how to latch on to your breast properly is very important. An improper latch can cause nipple pain and decreased milk supply for you and poor weight gain in your baby. Also, if your baby is not latched onto your nipple properly, he or she may swallow some air during feeding. This can make your baby fussy. Burping your baby when you switch breasts during the feeding can help to get rid of the air. However, teaching your baby to latch on properly is still the best way to prevent fussiness from swallowing air while breastfeeding. Signs that your baby has successfully latched on to your nipple:    Silent tugging or silent sucking, without causing you pain.   Swallowing heard between every 3-4 sucks.    Muscle movement above and in front of his or her ears while sucking.  Signs that your baby has not successfully latched on to nipple:   Sucking sounds or smacking sounds from your baby while breastfeeding.  Nipple pain. If you think your baby has not latched on correctly, slip your  finger into the corner of your baby's mouth to break the suction and place it between  your baby's gums. Attempt breastfeeding initiation again. Signs of Successful Breastfeeding Signs from your baby:   A gradual decrease in the number of sucks or complete cessation of sucking.   Falling asleep.   Relaxation of his or her body.   Retention of a small amount of milk in his or her mouth.   Letting go of your breast by himself or herself. Signs from you:  Breasts that have increased in firmness, weight, and size 1-3 hours after feeding.   Breasts that are softer immediately after breastfeeding.  Increased milk volume, as well as a change in milk consistency and color by the fifth day of breastfeeding.   Nipples that are not sore, cracked, or bleeding. Signs That Your Pecola Leisure is Getting Enough Milk  Wetting at least 3 diapers in a 24-hour period. The urine should be clear and pale yellow by age 11 days.  At least 3 stools in a 24-hour period by age 11 days. The stool should be soft and yellow.  At least 3 stools in a 24-hour period by age 76 days. The stool should be seedy and yellow.  No loss of weight greater than 10% of birth weight during the first 63 days of age.  Average weight gain of 4-7 ounces (113-198 g) per week after age 30 days.  Consistent daily weight gain by age 11 days, without weight loss after the age of 2 weeks. After a feeding, your baby may spit up a small amount. This is common. BREASTFEEDING FREQUENCY AND DURATION Frequent feeding will help you make more milk and can prevent sore nipples and breast engorgement. Breastfeed when you feel the need to reduce the fullness of your breasts or when your baby shows signs of hunger. This is called "breastfeeding on demand." Avoid introducing a pacifier to your baby while you are working to establish breastfeeding (the first 4-6 weeks after your baby is born). After this time you may choose to use a pacifier. Research has shown that pacifier use during the first year of a baby's life decreases the risk of  sudden infant death syndrome (SIDS). Allow your baby to feed on each breast as long as he or she wants. Breastfeed until your baby is finished feeding. When your baby unlatches or falls asleep while feeding from the first breast, offer the second breast. Because newborns are often sleepy in the first few weeks of life, you may need to awaken your baby to get him or her to feed. Breastfeeding times will vary from baby to baby. However, the following rules can serve as a guide to help you ensure that your baby is properly fed:  Newborns (babies 44 weeks of age or younger) may breastfeed every 1-3 hours.  Newborns should not go longer than 3 hours during the day or 5 hours during the night without breastfeeding.  You should breastfeed your baby a minimum of 8 times in a 24-hour period until you begin to introduce solid foods to your baby at around 28 months of age. BREAST MILK PUMPING Pumping and storing breast milk allows you to ensure that your baby is exclusively fed your breast milk, even at times when you are unable to breastfeed. This is especially important if you are going back to work while you are still breastfeeding or when you are not able to be present during feedings. Your lactation consultant can give you guidelines  on how long it is safe to store breast milk.  A breast pump is a machine that allows you to pump milk from your breast into a sterile bottle. The pumped breast milk can then be stored in a refrigerator or freezer. Some breast pumps are operated by hand, while others use electricity. Ask your lactation consultant which type will work best for you. Breast pumps can be purchased, but some hospitals and breastfeeding support groups lease breast pumps on a monthly basis. A lactation consultant can teach you how to hand express breast milk, if you prefer not to use a pump.  CARING FOR YOUR BREASTS WHILE YOU BREASTFEED Nipples can become dry, cracked, and sore while breastfeeding. The  following recommendations can help keep your breasts moisturized and healthy:  Avoid using soap on your nipples.   Wear a supportive bra. Although not required, special nursing bras and tank tops are designed to allow access to your breasts for breastfeeding without taking off your entire bra or top. Avoid wearing underwire-style bras or extremely tight bras.  Air dry your nipples for 3-35minutes after each feeding.   Use only cotton bra pads to absorb leaked breast milk. Leaking of breast milk between feedings is normal.   Use lanolin on your nipples after breastfeeding. Lanolin helps to maintain your skin's normal moisture barrier. If you use pure lanolin, you do not need to wash it off before feeding your baby again. Pure lanolin is not toxic to your baby. You may also hand express a few drops of breast milk and gently massage that milk into your nipples and allow the milk to air dry. In the first few weeks after giving birth, some women experience extremely full breasts (engorgement). Engorgement can make your breasts feel heavy, warm, and tender to the touch. Engorgement peaks within 3-5 days after you give birth. The following recommendations can help ease engorgement:  Completely empty your breasts while breastfeeding or pumping. You may want to start by applying warm, moist heat (in the shower or with warm water-soaked hand towels) just before feeding or pumping. This increases circulation and helps the milk flow. If your baby does not completely empty your breasts while breastfeeding, pump any extra milk after he or she is finished.  Wear a snug bra (nursing or regular) or tank top for 1-2 days to signal your body to slightly decrease milk production.  Apply ice packs to your breasts, unless this is too uncomfortable for you.  Make sure that your baby is latched on and positioned properly while breastfeeding. If engorgement persists after 48 hours of following these recommendations,  contact your health care provider or a Advertising copywriter. OVERALL HEALTH CARE RECOMMENDATIONS WHILE BREASTFEEDING  Eat healthy foods. Alternate between meals and snacks, eating 3 of each per day. Because what you eat affects your breast milk, some of the foods may make your baby more irritable than usual. Avoid eating these foods if you are sure that they are negatively affecting your baby.  Drink milk, fruit juice, and water to satisfy your thirst (about 10 glasses a day).   Rest often, relax, and continue to take your prenatal vitamins to prevent fatigue, stress, and anemia.  Continue breast self-awareness checks.  Avoid chewing and smoking tobacco.  Avoid alcohol and drug use. Some medicines that may be harmful to your baby can pass through breast milk. It is important to ask your health care provider before taking any medicine, including all over-the-counter and prescription medicine as well  as vitamin and herbal supplements. It is possible to become pregnant while breastfeeding. If birth control is desired, ask your health care provider about options that will be safe for your baby. SEEK MEDICAL CARE IF:   You feel like you want to stop breastfeeding or have become frustrated with breastfeeding.  You have painful breasts or nipples.  Your nipples are cracked or bleeding.  Your breasts are red, tender, or warm.  You have a swollen area on either breast.  You have a fever or chills.  You have nausea or vomiting.  You have drainage other than breast milk from your nipples.  Your breasts do not become full before feedings by the fifth day after you give birth.  You feel sad and depressed.  Your baby is too sleepy to eat well.  Your baby is having trouble sleeping.   Your baby is wetting less than 3 diapers in a 24-hour period.  Your baby has less than 3 stools in a 24-hour period.  Your baby's skin or the white part of his or her eyes becomes yellow.   Your baby  is not gaining weight by 18 days of age. SEEK IMMEDIATE MEDICAL CARE IF:   Your baby is overly tired (lethargic) and does not want to wake up and feed.  Your baby develops an unexplained fever. Document Released: 04/19/2005 Document Revised: 04/24/2013 Document Reviewed: 10/11/2012 [facility] Patient Information 2015 [place], [place]. This information is not intended to replace advice given to you by your health care provider. Make sure you discuss any questions you have with your health care provider.

## 2014-06-17 MED ORDER — IBUPROFEN 600 MG PO TABS: 600.0000 mg | ORAL_TABLET | Freq: Four times a day (QID) | ORAL | Status: AC

## 2014-06-17 MED ORDER — OXYCODONE-ACETAMINOPHEN 5-325 MG PO TABS: 1.0000 | ORAL_TABLET | ORAL | Status: AC | PRN

## 2014-06-17 MED ORDER — FERROUS SULFATE 325 (65 FE) MG PO TABS: 325.0000 mg | ORAL_TABLET | Freq: Two times a day (BID) | ORAL | Status: AC

## 2014-06-17 NOTE — Lactation Note (Addendum)
This note was copied from the chart of Brittany Wiggins Lehmkuhl. Lactation Consultation Note  Mother states baby breastfed for 5 min on #20NS but forgot to call for assistance.  FOB syringe fed baby12 formula with syringe. Discussed prefilling NS with curved tip syringe to stimulate sucking.  Sugest she call before discharge for assistance w/ feeding. Mother states she did pump and got minimal amount.  Encouraged mother to pump at least 6 times a day for 15-20 min. Mother has Medela DEBP at home.  Recommend parents switch from syringe feeding to slow flow nipple bottle because his volume is increasing. Set up outpt appt for Thurs 2/18 at 2:30pm. Parents have volume guidelines.  Reviewed engorgement care and monitoring voids/stools.   Patient Name: Brittany Wiggins Joshi OZHYQ'MToday's Date: 06/17/2014 Reason for consult: Follow-up assessment   Maternal Data    Feeding    LATCH Score/Interventions                      Lactation Tools Discussed/Used     Consult Status Consult Status: Follow-up Date: 06/20/14 Follow-up type: Out-patient    Dahlia ByesBerkelhammer, Sophea Rackham [facility] 06/17/2014, 12:51 PM

## 2014-06-17 NOTE — Lactation Note (Addendum)
This note was copied from the chart of Brittany Wiggins. Lactation Consultation Note  Baby has had difficulty sustaining latch.  Using #20NS. Attempted latching but baby recently had 12.5 ml of formula at approx 0700 and is sleepy at the breast. Baby is unable to protrude tongue out of mouth, humps tongue when sucking and cups tongue when crying.  Mid posterior frenulum off center noted causing tongue indention. Gable MD discussed tongue evaluation with parents.  They will call for assistance w/ next feeding. Encouraged mother to pump every 3 hours to stimulate milk supply.  Was able to express drops of colostrum. Mom has  LC # to call for assist w/next feeding.  Patient Name: Brittany Derrek GuJoslyn Suliman WUJWJ'XToday's Date: 06/17/2014     Maternal Data    Feeding Feeding Type: Breast Fed Length of feed: 20 min  LATCH Score/Interventions                      Lactation Tools Discussed/Used Nipple shield size: 20   Consult Status      Dahlia ByesBerkelhammer, Ashtynn Berke Boschen 06/17/2014, 10:00 AM

## 2014-06-17 NOTE — Lactation Note (Addendum)
This note was copied from the chart of Boy Derrek GuJoslyn Mehlberg. Lactation Consultation Note Mom states baby isn't latching well. Baby has a little mouth and tight suck. With gloved finger assessed suck to stimulate, noted irregular suckle pattern and wouldn't cup tongue under finger. Tried suck training but wouldn't allow tongue to come forward. Mom has everted nipples, sore. I feel like baby isn't getting a deep latch, has been sleepy. Gave mom shells to wear in bra to assist nipple out more and prevent further soreness. RN gave comfort gels. I fitted mom w/#20 NS, baby wouldn't latch. Gave 12ml Alimentum via syring and gloved finger. Gave mom feeding amount sheet. Asked mom to call for assistance w/NS for latching w/next BF. Hand expression unable to get colostrum, breast feels heavy, mom states heavier than in am. Patient Name: Boy Derrek GuJoslyn Tricarico ZOXWR'UToday's Date: 06/17/2014 Reason for consult: Follow-up assessment;Hyperbilirubinemia;Infant weight loss;Difficult latch   Maternal Data    Feeding Feeding Type: Formula  LATCH Score/Interventions Latch: Too sleepy or reluctant, no latch achieved, no sucking elicited. Intervention(s): Skin to skin;Teach feeding cues;Waking techniques Intervention(s): Breast massage  Intervention(s): Hand expression  Type of Nipple: Everted at rest and after stimulation  Comfort (Breast/Nipple): Filling, red/small blisters or bruises, mild/mod discomfort  Problem noted: Mild/Moderate discomfort Interventions (Mild/moderate discomfort): Comfort gels;Breast shields;Hand expression;Hand massage  Intervention(s): Skin to skin;Position options;Support Pillows;Breastfeeding basics reviewed     Lactation Tools Discussed/Used Tools: Shells;Nipple Shields;Pump;Comfort gels Nipple shield size: 20 Shell Type: Inverted Breast pump type: Double-Electric Breast Pump   Consult Status Consult Status: Follow-up Date: 06/17/14 Follow-up type: In-patient    Tashanda Fuhrer, Diamond NickelLAURA  G 06/17/2014, 1:24 AM

## 2014-06-20 ENCOUNTER — Ambulatory Visit (HOSPITAL_COMMUNITY): Admit: 2014-06-20 | Payer: BC Managed Care – PPO

## 2014-06-21 ENCOUNTER — Ambulatory Visit (HOSPITAL_COMMUNITY): Payer: BC Managed Care – PPO

## 2014-10-05 ENCOUNTER — Emergency Department (HOSPITAL_COMMUNITY)
Admission: EM | Admit: 2014-10-05 | Discharge: 2014-10-05 | Disposition: A | Discharge status: Home / Self Care | Payer: BC Managed Care – PPO | Attending: Emergency Medicine | Admitting: Emergency Medicine

## 2014-10-05 ENCOUNTER — Emergency Department (HOSPITAL_COMMUNITY): Payer: BC Managed Care – PPO

## 2014-10-05 ENCOUNTER — Encounter (HOSPITAL_COMMUNITY): Payer: Self-pay | Admitting: *Deleted

## 2014-10-05 DIAGNOSIS — R42 Dizziness and giddiness: Secondary | ICD-10-CM | POA: Insufficient documentation

## 2014-10-05 DIAGNOSIS — Z3202 Encounter for pregnancy test, result negative: Secondary | ICD-10-CM | POA: Diagnosis not present

## 2014-10-05 DIAGNOSIS — R51 Headache: Secondary | ICD-10-CM | POA: Diagnosis not present

## 2014-10-05 DIAGNOSIS — Z8619 Personal history of other infectious and parasitic diseases: Secondary | ICD-10-CM | POA: Insufficient documentation

## 2014-10-05 DIAGNOSIS — R112 Nausea with vomiting, unspecified: Secondary | ICD-10-CM | POA: Insufficient documentation

## 2014-10-05 DIAGNOSIS — Z79899 Other long term (current) drug therapy: Secondary | ICD-10-CM | POA: Diagnosis not present

## 2014-10-05 DIAGNOSIS — Z87448 Personal history of other diseases of urinary system: Secondary | ICD-10-CM | POA: Insufficient documentation

## 2014-10-05 DIAGNOSIS — Z9889 Other specified postprocedural states: Secondary | ICD-10-CM | POA: Insufficient documentation

## 2014-10-05 DIAGNOSIS — R519 Headache, unspecified: Secondary | ICD-10-CM

## 2014-10-05 LAB — CBC WITH DIFFERENTIAL/PLATELET
BASOS PCT: 0 % (ref 0–1)
Basophils Absolute: 0 10*3/uL (ref 0.0–0.1)
EOS ABS: 0.1 10*3/uL (ref 0.0–0.7)
EOS PCT: 0 % (ref 0–5)
HCT: 38.6 % (ref 36.0–46.0)
Hemoglobin: 13.1 g/dL (ref 12.0–15.0)
LYMPHS ABS: 1.8 10*3/uL (ref 0.7–4.0)
Lymphocytes Relative: 13 % (ref 12–46)
MCH: 27.1 pg (ref 26.0–34.0)
MCHC: 33.9 g/dL (ref 30.0–36.0)
MCV: 79.9 fL (ref 78.0–100.0)
MONO ABS: 0.9 10*3/uL (ref 0.1–1.0)
Monocytes Relative: 7 % (ref 3–12)
NEUTROS PCT: 80 % — AB (ref 43–77)
Neutro Abs: 10.6 10*3/uL — ABNORMAL HIGH (ref 1.7–7.7)
Platelets: 324 10*3/uL (ref 150–400)
RBC: 4.83 MIL/uL (ref 3.87–5.11)
RDW: 13.6 % (ref 11.5–15.5)
WBC: 13.4 10*3/uL — AB (ref 4.0–10.5)

## 2014-10-05 LAB — COMPREHENSIVE METABOLIC PANEL
ALK PHOS: 197 U/L — AB (ref 38–126)
ALT: 111 U/L — AB (ref 14–54)
AST: 80 U/L — ABNORMAL HIGH (ref 15–41)
Albumin: 3.9 g/dL (ref 3.5–5.0)
Anion gap: 13 (ref 5–15)
BILIRUBIN TOTAL: 0.4 mg/dL (ref 0.3–1.2)
BUN: 10 mg/dL (ref 6–20)
CO2: 21 mmol/L — ABNORMAL LOW (ref 22–32)
CREATININE: 0.94 mg/dL (ref 0.44–1.00)
Calcium: 9.4 mg/dL (ref 8.9–10.3)
Chloride: 102 mmol/L (ref 101–111)
Glucose, Bld: 117 mg/dL — ABNORMAL HIGH (ref 65–99)
Potassium: 3.4 mmol/L — ABNORMAL LOW (ref 3.5–5.1)
SODIUM: 136 mmol/L (ref 135–145)
Total Protein: 7.3 g/dL (ref 6.5–8.1)

## 2014-10-05 LAB — URINE MICROSCOPIC-ADD ON

## 2014-10-05 LAB — URINALYSIS, ROUTINE W REFLEX MICROSCOPIC
Bilirubin Urine: NEGATIVE
GLUCOSE, UA: NEGATIVE mg/dL
Hgb urine dipstick: NEGATIVE
KETONES UR: 40 mg/dL — AB
NITRITE: NEGATIVE
PH: 7.5 (ref 5.0–8.0)
Protein, ur: NEGATIVE mg/dL
Specific Gravity, Urine: 1.02 (ref 1.005–1.030)
UROBILINOGEN UA: 0.2 mg/dL (ref 0.0–1.0)

## 2014-10-05 LAB — CK: Total CK: 276 U/L — ABNORMAL HIGH (ref 38–234)

## 2014-10-05 LAB — POC URINE PREG, ED: Preg Test, Ur: NEGATIVE

## 2014-10-05 LAB — LIPASE, BLOOD: Lipase: 17 U/L — ABNORMAL LOW (ref 22–51)

## 2014-10-05 MED ORDER — ONDANSETRON HCL 4 MG/2ML IJ SOLN
4.0000 mg | Freq: Once | INTRAMUSCULAR | Status: AC
  Administered 2014-10-05: 4 mg via INTRAVENOUS
  Filled 2014-10-05: qty 2

## 2014-10-05 MED ORDER — KETOROLAC TROMETHAMINE 30 MG/ML IJ SOLN
30.0000 mg | Freq: Once | INTRAMUSCULAR | Status: AC
  Administered 2014-10-05: 30 mg via INTRAVENOUS
  Filled 2014-10-05: qty 1

## 2014-10-05 MED ORDER — SODIUM CHLORIDE 0.9 % IV BOLUS (SEPSIS)
1000.0000 mL | Freq: Once | INTRAVENOUS | Status: AC
  Administered 2014-10-05: 1000 mL via INTRAVENOUS

## 2014-10-05 NOTE — Discharge Instructions (Signed)
You were evaluated in the ED today for your symptoms. There does not appear to be an emergent cause her symptoms today. It is important to follow with primary care for further evaluation and management of your symptoms. Return to ED for any worsening symptoms.  Nausea and Vomiting Nausea is a sick feeling that often comes before throwing up (vomiting). Vomiting is a reflex where stomach contents come out of your mouth. Vomiting can cause severe loss of body fluids (dehydration). Children and elderly adults can become dehydrated quickly, especially if they also have diarrhea. Nausea and vomiting are symptoms of a condition or disease. It is important to find the cause of your symptoms. CAUSES   Direct irritation of the stomach lining. This irritation can result from increased acid production (gastroesophageal reflux disease), infection, food poisoning, taking certain medicines (such as nonsteroidal anti-inflammatory drugs), alcohol use, or tobacco use.  Signals from the brain.These signals could be caused by a headache, heat exposure, an inner ear disturbance, increased pressure in the brain from injury, infection, a tumor, or a concussion, pain, emotional stimulus, or metabolic problems.  An obstruction in the gastrointestinal tract (bowel obstruction).  Illnesses such as diabetes, hepatitis, gallbladder problems, appendicitis, kidney problems, cancer, sepsis, atypical symptoms of a heart attack, or eating disorders.  Medical treatments such as chemotherapy and radiation.  Receiving medicine that makes you sleep (general anesthetic) during surgery. DIAGNOSIS Your caregiver may ask for tests to be done if the problems do not improve after a few days. Tests may also be done if symptoms are severe or if the reason for the nausea and vomiting is not clear. Tests may include:  Urine tests.  Blood tests.  Stool tests.  Cultures (to look for evidence of infection).  X-rays or other imaging  studies. Test results can help your caregiver make decisions about treatment or the need for additional tests. TREATMENT You need to stay well hydrated. Drink frequently but in small amounts.You may wish to drink water, sports drinks, clear broth, or eat frozen ice pops or gelatin dessert to help stay hydrated.When you eat, eating slowly may help prevent nausea.There are also some antinausea medicines that may help prevent nausea. HOME CARE INSTRUCTIONS   Take all medicine as directed by your caregiver.  If you do not have an appetite, do not force yourself to eat. However, you must continue to drink fluids.  If you have an appetite, eat a normal diet unless your caregiver tells you differently.  Eat a variety of complex carbohydrates (rice, wheat, potatoes, bread), lean meats, yogurt, fruits, and vegetables.  Avoid high-fat foods because they are more difficult to digest.  Drink enough water and fluids to keep your urine clear or pale yellow.  If you are dehydrated, ask your caregiver for specific rehydration instructions. Signs of dehydration may include:  Severe thirst.  Dry lips and mouth.  Dizziness.  Dark urine.  Decreasing urine frequency and amount.  Confusion.  Rapid breathing or pulse. SEEK IMMEDIATE MEDICAL CARE IF:   You have blood or brown flecks (like coffee grounds) in your vomit.  You have black or bloody stools.  You have a severe headache or stiff neck.  You are confused.  You have severe abdominal pain.  You have chest pain or trouble breathing.  You do not urinate at least once every 8 hours.  You develop cold or clammy skin.  You continue to vomit for longer than 24 to 48 hours.  You have a fever. MAKE SURE  YOU:   Understand these instructions.  Will watch your condition.  Will get help right away if you are not doing well or get worse. Document Released: 04/19/2005 Document Revised: 07/12/2011 Document Reviewed:  09/16/2010 [facility] Patient Information 2015 [place]. This information is not intended to replace advice given to you by your health care provider. Make sure you discuss any questions you have with your health care provider.

## 2014-10-05 NOTE — ED Provider Notes (Signed)
CSN: [id number]     Arrival date & time 10/05/14  1225 History   First MD Initiated Contact with Patient 10/05/14 1308     Chief Complaint  Patient presents with  . Emesis  . Dizziness     (Consider location/radiation/quality/duration/timing/severity/associated sxs/prior Treatment) HPI   Patient name is a 29 year old female with no significant past medical history who presents emergency Department with chief complaint of nausea, vomiting and headache. She is status post normal vaginal delivery of a healthy child 3 months ago. Today she was working out with her family members outside in the heat. Patient states about 15 into the minutes into the workout, she became nauseous. She sat down. She had one episode of vomiting followed by numbness and tingling in her hands. EMS was called to the scene. However, patient states she was feeling better and declined to come to the hospital at that time. Her friends sat down with her initiated and she lied down for approximately an hour and a half. She had associated lightheadedness and felt as if she was going to pass out. She had several more episodes of vomiting and then came to the emergency department. She complains of nausea and headache. She also feels very tired. She still feels lightheaded. She denies abdominal pain. No known contacts with similar symptoms..10  Past Medical History  Diagnosis Date  . LSIL (low grade squamous intraepithelial lesion) on Pap smear 2012  . Dysplasia of cervix, low grade (CIN 1) 2012  . HPV (human papilloma virus) infection 2011  . Hx of colposcopy with cervical biopsy 2010, 2012   Past Surgical History  Procedure Laterality Date  . No past surgeries     Family History  Problem Relation Age of Onset  . Hypertension Mother   . Hypertension Father   . Diabetes Father   . Migraines Father   . Diabetes Paternal Grandmother   . Diabetes Paternal Grandfather    History  Substance Use Topics  . Smoking status:  Never Smoker   . Smokeless tobacco: Never Used  . Alcohol Use: Yes   OB History    Gravida Para Term Preterm AB TAB SAB Ectopic Multiple Living   0 1     Review of Systems  Ten systems reviewed and are negative for acute change, except as noted in the HPI.    Allergies  Fish allergy  Home Medications   Prior to Admission medications   Medication Sig Start Date End Date Taking? Authorizing Provider  ferrous sulfate 325 (65 FE) MG tablet Take 1 tablet (325 mg total) by mouth 2 (two) times daily with a meal. 06/17/14   Venus Standard, CNM  ibuprofen (ADVIL,MOTRIN) 600 MG tablet Take 1 tablet (600 mg total) by mouth every 6 (six) hours. 06/17/14   Venus Standard, CNM  oxyCODONE-acetaminophen (PERCOCET/ROXICET) 5-325 MG per tablet Take 1 tablet by mouth every 4 (four) hours as needed (for pain scale less than 7). 06/17/14   Venus Standard, CNM   BP 115/78 mmHg  Pulse 85  Temp(Src) 97.8 F (36.6 C) (Oral)  Resp 19  SpO2 100% Physical Exam  Constitutional: She is oriented to person, place, and time. She appears well-developed and well-nourished. No distress.  HENT:  Head: Normocephalic and atraumatic.  Eyes: Conjunctivae are normal. No scleral icterus.  Neck: Normal range of motion.  Cardiovascular: Normal rate, regular rhythm and normal heart sounds.  Exam reveals no gallop and no friction rub.  No murmur heard. Pulmonary/Chest: Effort normal and breath sounds normal. No respiratory distress.  Abdominal: Soft. Bowel sounds are normal. She exhibits no distension and no mass. There is no tenderness. There is no guarding.  Neurological: She is alert and oriented to person, place, and time.  Speech is clear and goal oriented, follows commands Major Cranial nerves without deficit, no facial droop Normal strength in upper and lower extremities bilaterally including dorsiflexion and plantar flexion, strong and equal grip strength Sensation normal to light and sharp  touch Moves extremities without ataxia, coordination intact Normal finger to nose and rapid alternating movements Neg romberg, no pronator drift Gait deferred   Skin: Skin is warm and dry. She is not diaphoretic.  Nursing note and vitals reviewed.   ED Course  Procedures (including critical care time) Labs Review Labs Reviewed  CBC WITH DIFFERENTIAL/PLATELET - Abnormal; Notable for the following:    WBC 13.4 (*)    Neutrophils Relative % 80 (*)    Neutro Abs 10.6 (*)    All other components within normal limits  COMPREHENSIVE METABOLIC PANEL - Abnormal; Notable for the following:    Potassium 3.4 (*)    CO2 21 (*)    Glucose, Bld 117 (*)    AST 80 (*)    ALT 111 (*)    Alkaline Phosphatase 197 (*)    All other components within normal limits  LIPASE, BLOOD - Abnormal; Notable for the following:    Lipase 17 (*)    All other components within normal limits  CK - Abnormal; Notable for the following:    Total CK 276 (*)    All other components within normal limits  URINALYSIS, ROUTINE W REFLEX MICROSCOPIC (NOT AT [facility])  POC URINE PREG, ED    Imaging Review No results found.   EKG Interpretation None       ECG interpretation   Date: 10/05/2014  Rate: 67  Rhythm: normal sinus rhythm  QRS Axis: normal  Intervals: normal  ST/T Wave abnormalities: normal  Conduction Disutrbances: none  Narrative Interpretation:       MDM   Final diagnoses:  None    This is a 29 year old female. She states that she was not working out prior to getting pregnant and had did not work out through her pregnancy. This her first episode of working out. She was sprinting up with a heavy ball. 50 minutes into the exercise became nauseous and vomited. Ambient temperature outside is in the 90s. Patient is mildly hypotensive upon arrival. She skied receiving fluids, Toradol, Zofran. She is a leukocytosis of 13,000. Mild hypokalemia, her bicarbonate is low at 21. Her AST and ALT are  elevated with elevated alkaline phosphatase. Her total CK is slightly elevated. Because of patient's recent pregnancy, nausea, vomiting, and elevated liver enzymes. I ordered an abdominal ultrasound. Otherwise, I suspect the patient has deconditioning and heat injury.  4:13 PM Filed Vitals:   10/05/14 1300 10/05/14 1330 10/05/14 1400 10/05/14 1500  BP: 103/58 104/65 115/78 119/71  Pulse: 67 77 85 88  Temp:      TempSrc:      Resp:   19 20  SpO2: 100% 100% 100% 100%   Patient currently in ultrasound. Read is pending. I have given report to PA Cartner. He will disposition the patient after resulting. By mouth challenge. If discharged and follow up for recheck of liver enzymes.  Arthor CaptainAbigail Margueritte Guthridge, PA-C 10/05/14 1614  Margarita Grizzleanielle Ray, MD 10/05/14 [phone]

## 2014-10-05 NOTE — ED Provider Notes (Signed)
Patient care signed out to me by the Harris, PA-C. Plan is for follow-up on abdominal ultrasound.  6:34 PM--upon reevaluation of patient, states she is feeling much better and is ready go home. Patient appears well. Vitals are stable, ultrasound is unremarkable. Vital signs have remained stable throughout patient's entire ED visit. Patient is tolerating PO in the ED. Discussed symptomatic care at home, rehydration strategies and return precautions. Patient and family at bedside verbalized understanding and are amenable to this plan. Filed Vitals:   10/05/14 1800  BP: 142/66  Pulse: 71  Temp:   Resp: 17   No evidence of HELLP syndrome, emergent rhabdomyolysis or other acute or emergent pathology at this time. Patient is stable, in good condition and is appropriate for discharge follow-up with PCP.   Joycie PeekBenjamin Anne Boltz, PA-C 10/05/14 1846  Glynn OctaveStephen Rancour, MD 10/06/14 [phone]

## 2014-10-05 NOTE — ED Notes (Signed)
Patient transported to Ultrasound 

## 2014-10-05 NOTE — ED Notes (Signed)
Pt reports exercising today and then onset of n/v followed by feeling lightheaded. Now has tingling sensation to fingers. No acute distress noted at triage.

## 2015-03-09 IMAGING — CR DG PORTABLE PELVIS
1 series · 1 of 1 positions shown · non-contrast
Comparison: None.

CLINICAL DATA: 28-year-old female with missing sponge following
vaginal delivery. Evaluate for retained sponge.

EXAM:
PORTABLE PELVIS 1-2 VIEWS

[view not recorded]
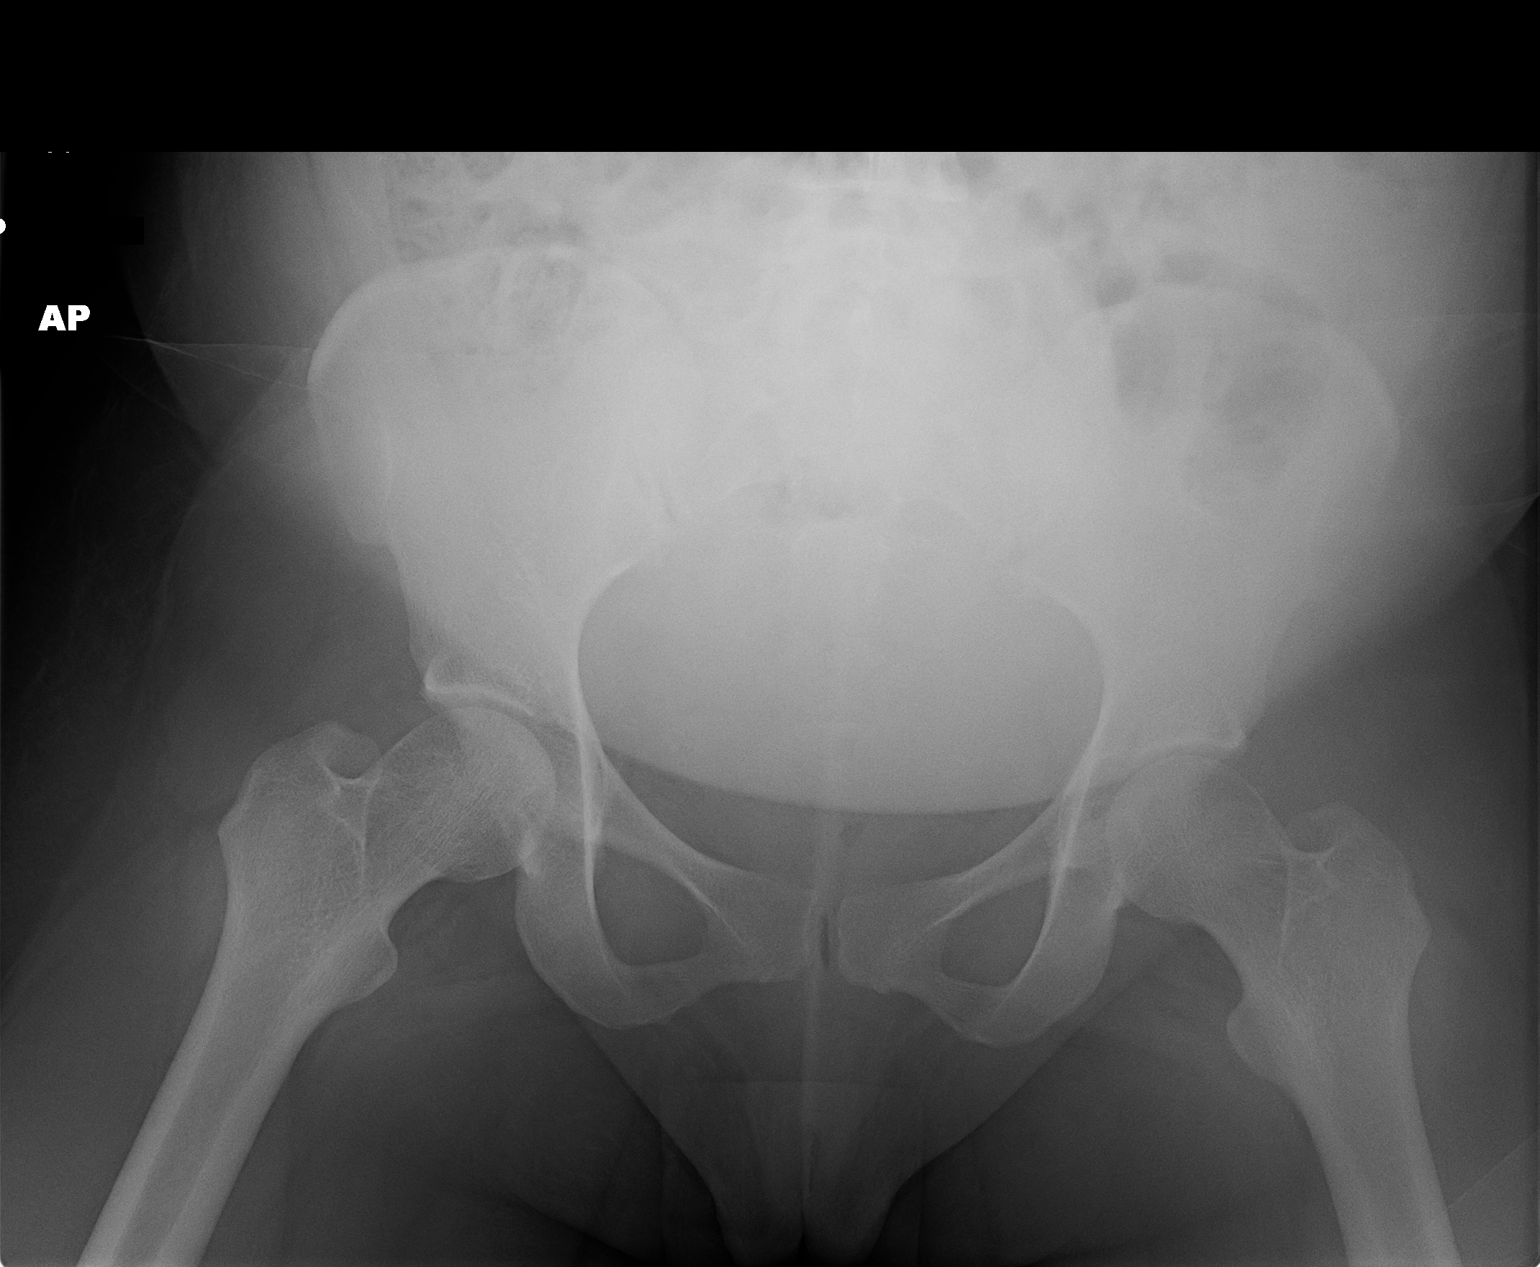

[1 of 1 positions shown; findings below may reference images not displayed]

FINDINGS: There is no evidence of unexpected radiopaque foreign body.

The bony structures are unremarkable.

The bowel gas pattern is unremarkable.
IMPRESSION: No evidence of unexpected radiopaque foreign body.

## 2016-11-02 ENCOUNTER — Other Ambulatory Visit: Payer: Self-pay | Admitting: Obstetrics and Gynecology

## 2016-11-02 DIAGNOSIS — E049 Nontoxic goiter, unspecified: Secondary | ICD-10-CM

## 2016-11-10 ENCOUNTER — Ambulatory Visit
Admission: RE | Admit: 2016-11-10 | Discharge: 2016-11-10 | Disposition: A | Discharge status: Home / Self Care | Payer: BC Managed Care – PPO | Source: Ambulatory Visit | Attending: Obstetrics and Gynecology | Admitting: Obstetrics and Gynecology

## 2016-11-10 DIAGNOSIS — E049 Nontoxic goiter, unspecified: Secondary | ICD-10-CM

## 2018-09-15 IMAGING — US US THYROID
1 series · 14 of 25 positions shown · non-contrast
Comparison: None available

CLINICAL DATA: Goiter.

EXAM:
THYROID ULTRASOUND
TECHNIQUE: Ultrasound examination of the thyroid gland and adjacent soft
tissues was performed.

[Series 1: us thyroid · 0.07mm/px · 14 of 48 slices shown]
[im 1/48]
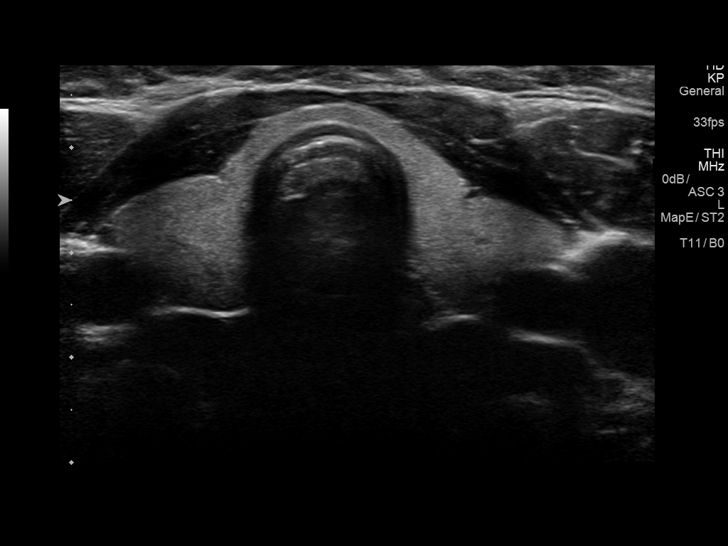
[im 4/48]
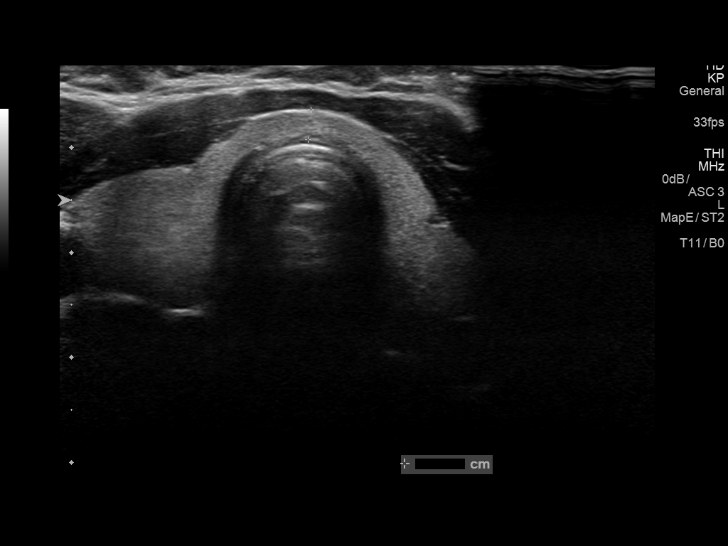
[im 8/48]
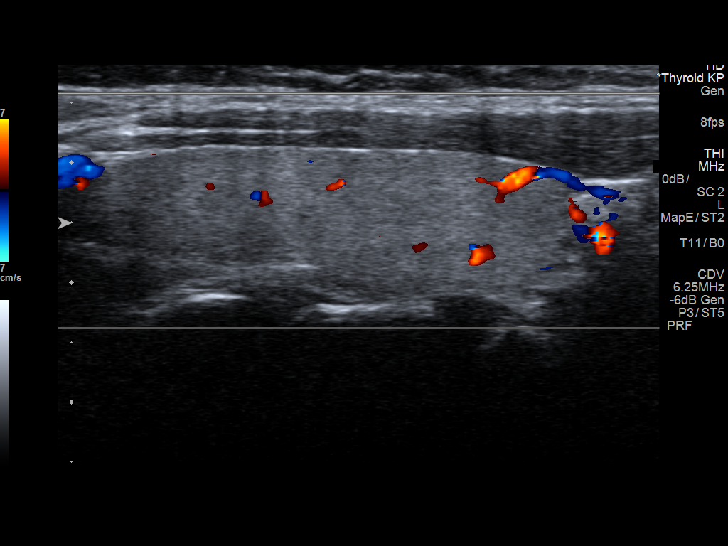
[im 12/48]
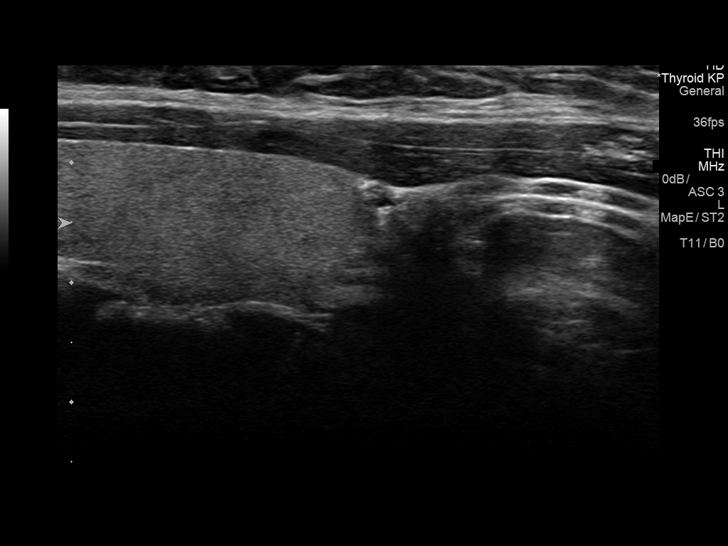
[im 16/48]
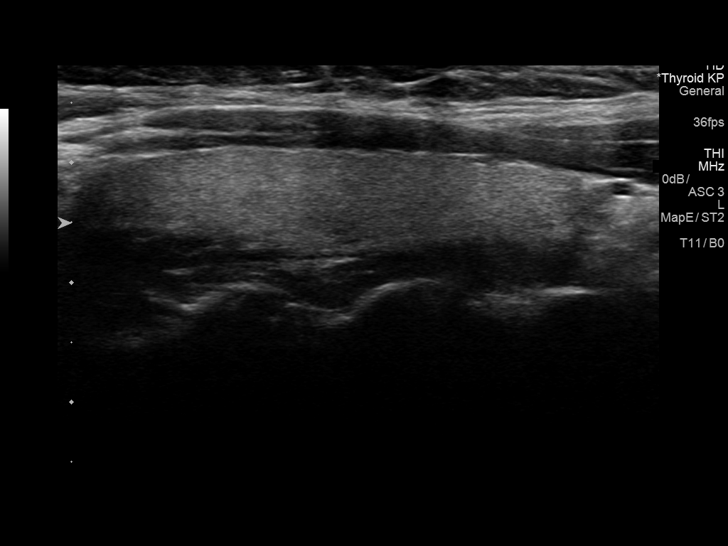
[im 18/48]
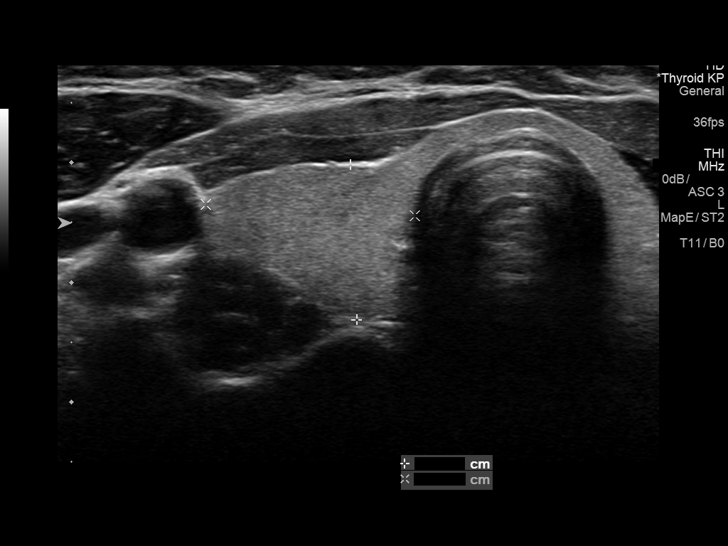
[im 22/48]
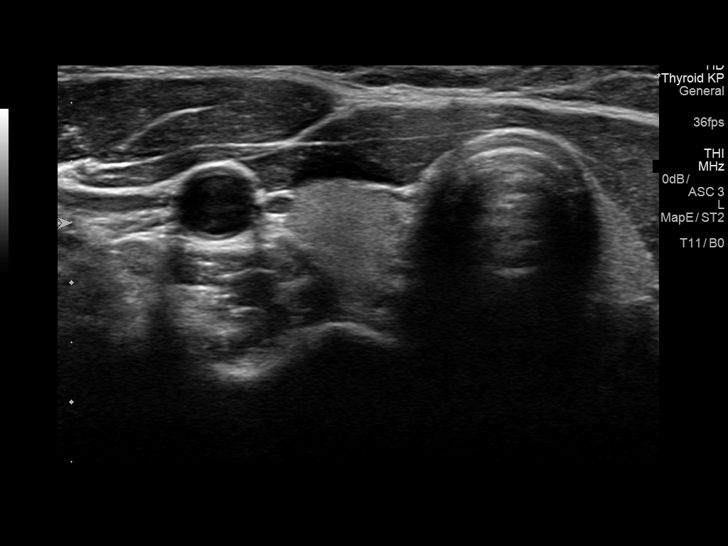
[im 26/48]
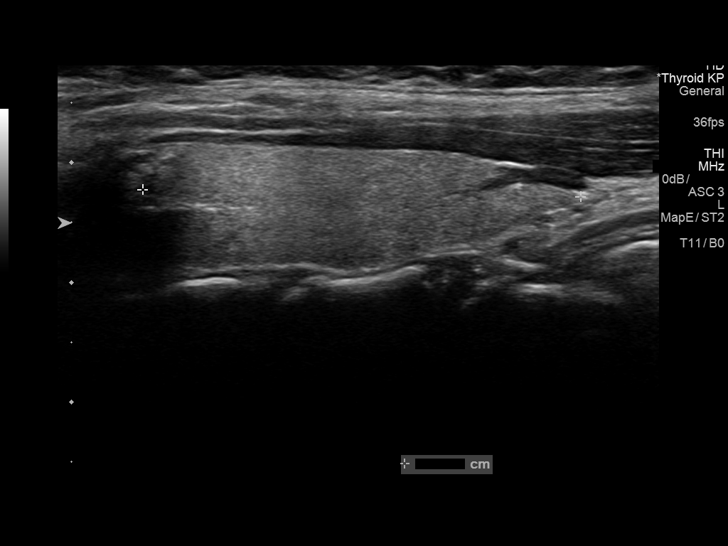
[im 30/48]
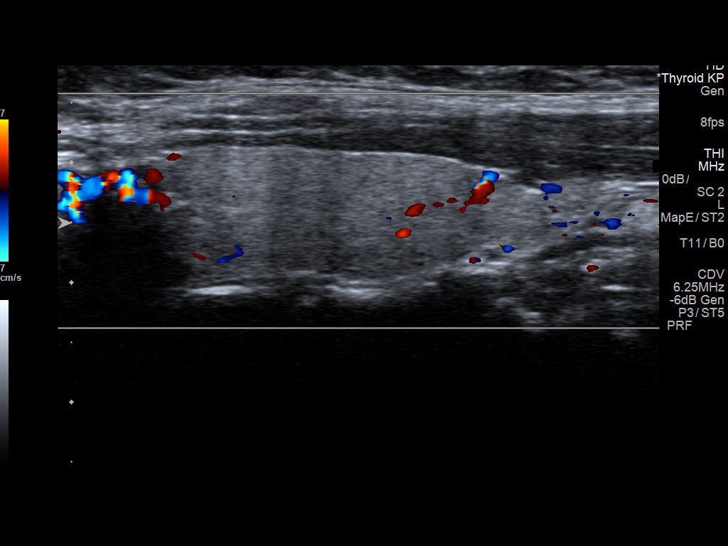
[im 32/48]
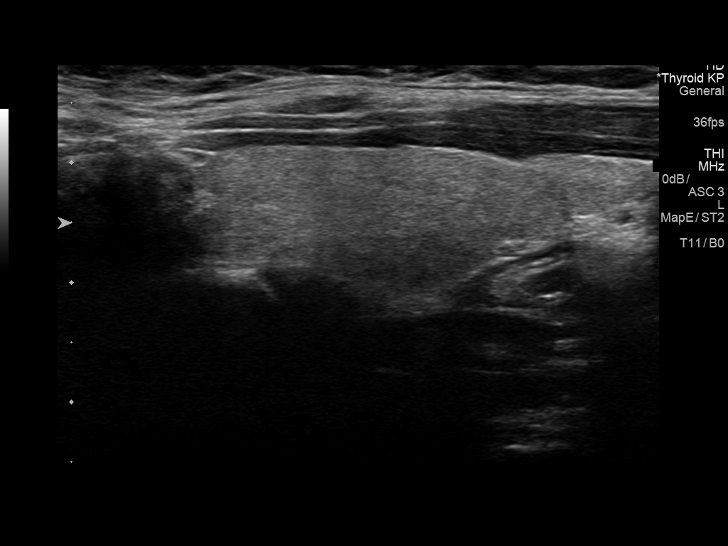
[im 36/48]
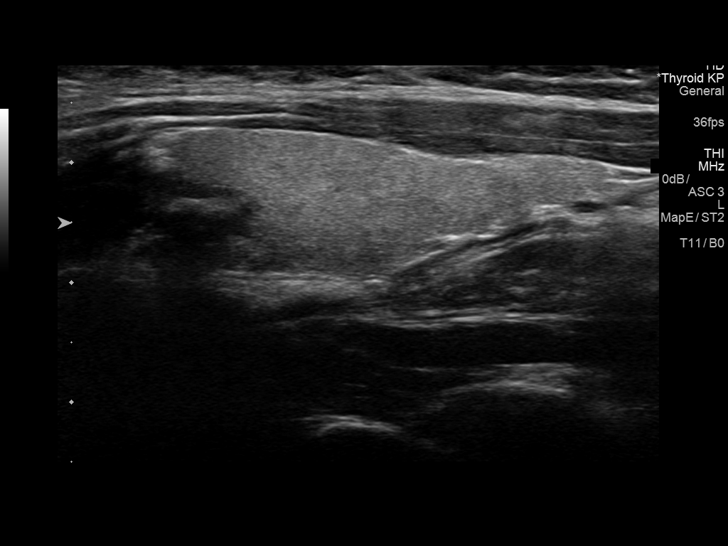
[im 40/48]
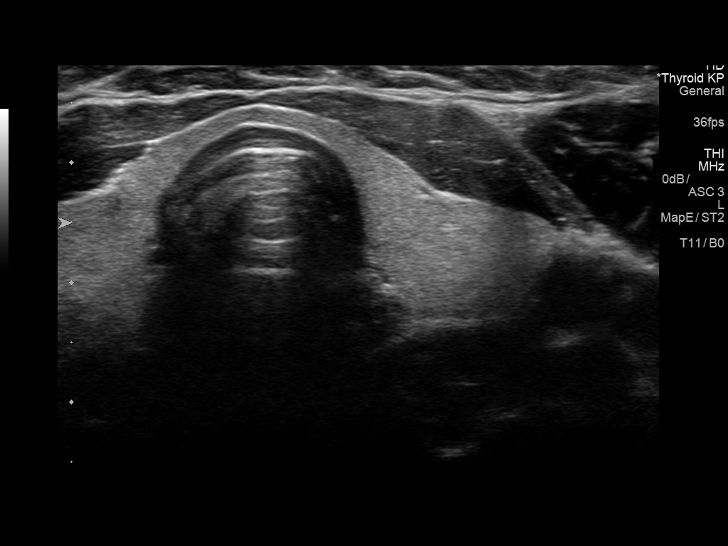
[im 44/48]
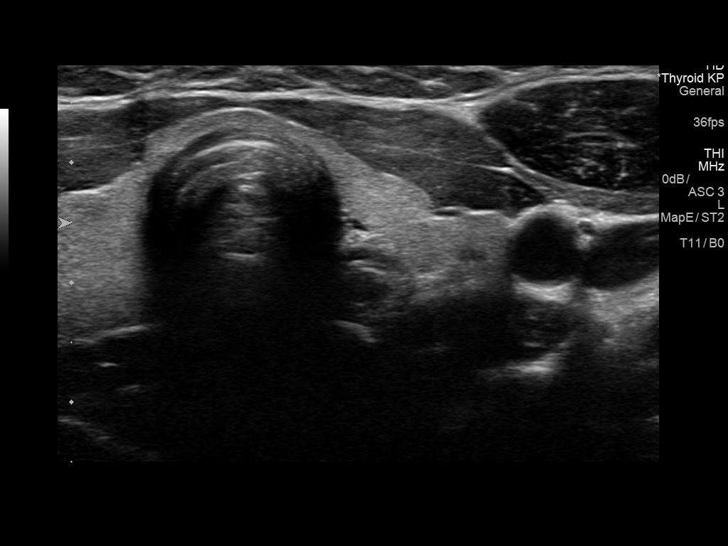
[im 48/48]
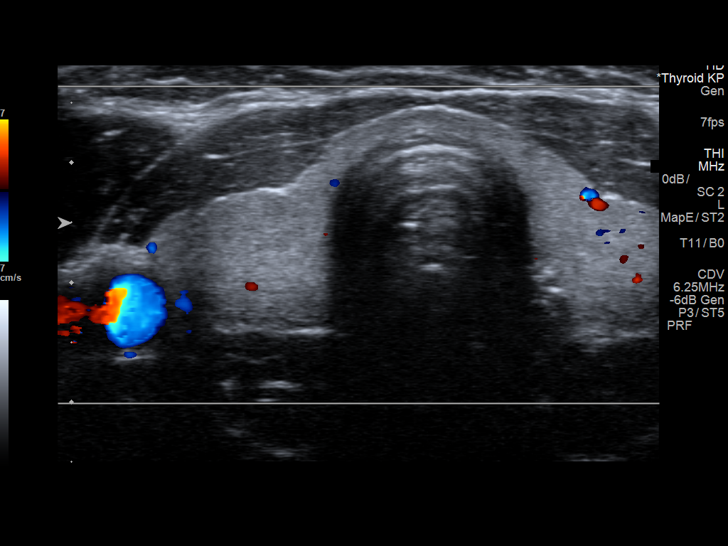

[14 of 25 positions shown; findings below may reference images not displayed]

FINDINGS: Parenchymal Echotexture: Normal

Isthmus: 3 mm

Right lobe: 4.3 x 1.3 x 1.8 cm

Left lobe: 3.7 x 1.1 x 1.7 cm

_________________________________________________________

Estimated total number of nodules >/= 1 cm: 0

Number of spongiform nodules >/=  2 cm not described below (TR1): 0

Number of mixed cystic and solid nodules >/= 1.5 cm not described
below (TR2): 0

_________________________________________________________

No discrete nodules are seen within the thyroid gland.
IMPRESSION: Normal thyroid ultrasound

The above is in keeping with the ACR TI-RADS recommendations - [HOSPITAL] 0669;[DATE].
